# Patient Record
Sex: Male | Born: 1950 | Hispanic: No | Marital: Married | Smoking: Never smoker
Health system: Southern US, Community
[De-identification: ages and names within clinical notes are randomized; demographics above are authoritative.]

## PROBLEM LIST (undated history)

## (undated) DIAGNOSIS — I10 Essential (primary) hypertension: Secondary | ICD-10-CM

## (undated) DIAGNOSIS — E119 Type 2 diabetes mellitus without complications: Secondary | ICD-10-CM

## (undated) DIAGNOSIS — E039 Hypothyroidism, unspecified: Secondary | ICD-10-CM

## (undated) DIAGNOSIS — N200 Calculus of kidney: Secondary | ICD-10-CM

## (undated) HISTORY — PX: CYST EXCISION: SHX5701

## (undated) HISTORY — PX: LITHOTRIPSY: SUR834

---

## 2017-01-31 ENCOUNTER — Ambulatory Visit: Payer: Self-pay | Admitting: Emergency Medicine

## 2017-04-01 DIAGNOSIS — E039 Hypothyroidism, unspecified: Secondary | ICD-10-CM | POA: Insufficient documentation

## 2017-04-01 DIAGNOSIS — Z125 Encounter for screening for malignant neoplasm of prostate: Secondary | ICD-10-CM | POA: Insufficient documentation

## 2017-04-03 DIAGNOSIS — E785 Hyperlipidemia, unspecified: Secondary | ICD-10-CM | POA: Insufficient documentation

## 2017-08-26 ENCOUNTER — Ambulatory Visit (HOSPITAL_COMMUNITY)
Admission: EM | Admit: 2017-08-26 | Discharge: 2017-08-26 | Disposition: A | Discharge status: Home / Self Care | Payer: Medicare HMO

## 2017-08-26 ENCOUNTER — Other Ambulatory Visit: Payer: Self-pay

## 2017-08-26 ENCOUNTER — Encounter (HOSPITAL_COMMUNITY): Payer: Self-pay | Admitting: Emergency Medicine

## 2017-08-26 DIAGNOSIS — W540XXA Bitten by dog, initial encounter: Secondary | ICD-10-CM | POA: Diagnosis not present

## 2017-08-26 DIAGNOSIS — M79642 Pain in left hand: Secondary | ICD-10-CM | POA: Diagnosis not present

## 2017-08-26 DIAGNOSIS — S61412A Laceration without foreign body of left hand, initial encounter: Secondary | ICD-10-CM

## 2017-08-26 HISTORY — DX: Calculus of kidney: N20.0

## 2017-08-26 HISTORY — DX: Essential (primary) hypertension: I10

## 2017-08-26 HISTORY — DX: Type 2 diabetes mellitus without complications: E11.9

## 2017-08-26 MED ORDER — AMOXICILLIN-POT CLAVULANATE 875-125 MG PO TABS: 1.0000 | ORAL_TABLET | Freq: Two times a day (BID) | ORAL | 0 refills | Status: DC

## 2017-08-26 NOTE — ED Notes (Signed)
[facility] report completed and faxed. To be scanned into chart.

## 2017-08-26 NOTE — ED Provider Notes (Signed)
  MRN: [id number] DOB: 06/12/1950  Subjective:   Rodney Miller is a 67 y.o. male presenting for suffering a dog bite to his left hand today over palmar surface.  Patient reports that the dog is up-to-date on his vaccines and is known to them.  Reports that he cleaned his wound at home with warm soapy water and peroxide.  His last Tdap was about 3 years ago per patient.  Denies redness, red streaks, fever, nausea, vomiting, belly pain.  No current facility-administered medications for this encounter.   Current Outpatient Medications:  .  atorvastatin (LIPITOR) 20 MG tablet, Take 20 mg by mouth daily., Disp: , Rfl:  .  Insulin Disposable Pump (V-GO 20) KIT, by Does not apply route., Disp: , Rfl:  .  lisinopril (PRINIVIL,ZESTRIL) 10 MG tablet, Take 10 mg by mouth daily., Disp: , Rfl:  .  naproxen sodium (ALEVE) 220 MG tablet, Take 440 mg by mouth., Disp: , Rfl:    No Known Allergies  Past Medical History:  Diagnosis Date  . Diabetes mellitus without complication ([place])   . Hypertension   . Kidney stones      Denies past surgical history.  Objective:   Vitals: BP (!) 155/66 (BP Location: Right Arm)   Pulse (!) 57   Temp 98 F (36.7 C) (Oral)   Resp 18   SpO2 99%   Physical Exam  Constitutional: He is oriented to person, place, and time. He appears well-developed and well-nourished.  Cardiovascular: Normal rate.  Pulmonary/Chest: Effort normal.  Musculoskeletal:       Hands: Neurological: He is alert and oriented to person, place, and time.   Assessment and Plan :   Dog bite, initial encounter  Laceration of left hand without foreign body, initial encounter  Left hand pain  Patient is a start Augmentin for his dog bite to the hand.  Short arm splint applied for mobilization.  Patient is to follow-up in 2 days for wound recheck.  Counseled on return to clinic precautions.  Tdap is up-to-date.   Jaynee Eagles, [place] 08/26/17 2129

## 2017-08-26 NOTE — Discharge Instructions (Addendum)
Tome 500mg  de Tylenol con ibuprofen 400mg  cada 6 horas con comida para dolor y inflammacion.     Jaynee Eagles, PA-C [facility] [address] [place], [place] [address]

## 2017-08-26 NOTE — ED Triage Notes (Signed)
The patient presented to the [facility] with a complaint of a dog bite to the left hand that occurred earlier today. The patient reported that they owned the dog and the rabies vaccination is current.

## 2017-08-26 NOTE — ED Notes (Signed)
Patient's hand placed in a CHG and sterile water soak.

## 2017-08-28 ENCOUNTER — Encounter: Payer: Self-pay | Admitting: Urgent Care

## 2017-08-28 ENCOUNTER — Ambulatory Visit (INDEPENDENT_AMBULATORY_CARE_PROVIDER_SITE_OTHER): Payer: Medicare HMO | Admitting: Urgent Care

## 2017-08-28 ENCOUNTER — Other Ambulatory Visit: Payer: Self-pay

## 2017-08-28 VITALS — BP 124/68 | HR 75 | Temp 97.9°F | Resp 16 | Ht 65.0 in | Wt 161.0 lb

## 2017-08-28 DIAGNOSIS — S61452A Open bite of left hand, initial encounter: Secondary | ICD-10-CM

## 2017-08-28 DIAGNOSIS — W540XXA Bitten by dog, initial encounter: Secondary | ICD-10-CM | POA: Diagnosis not present

## 2017-08-28 DIAGNOSIS — M79642 Pain in left hand: Secondary | ICD-10-CM

## 2017-08-28 DIAGNOSIS — L03114 Cellulitis of left upper limb: Secondary | ICD-10-CM | POA: Diagnosis not present

## 2017-08-28 NOTE — Patient Instructions (Addendum)
Animal Bite Animal bites can range from mild to serious. An animal bite can result in a scratch on the skin, a deep open cut, a puncture of the skin, a crush injury, or tearing away of the skin or a body part. A small bite from a house pet will usually not cause serious problems. However, some animal bites can become infected or injure a bone or other tissue. Bites from certain animals can be more dangerous because of the risk of spreading rabies, which is a serious viral infection. This risk is higher with bites from stray animals or wild animals, such as raccoons, foxes, skunks, and bats. Dogs are responsible for most animal bites. Children are bitten more often than adults. What are the signs or symptoms? Common symptoms of an animal bite include:  Pain.  Bleeding.  Swelling.  Bruising.  How is this diagnosed? This condition may be diagnosed based on a physical exam and medical history. Your health care provider will examine the wound and ask for details about the animal and how the bite happened. You may also have tests, such as:  Blood tests to check for infection or to determine if surgery is needed.  X-rays to check for damage to bones or joints.  Culture test. This uses a sample of fluid from the wound to check for infection.  How is this treated? Treatment varies depending on the location and type of animal bite and your medical history. Treatment may include:  Wound care. This often includes cleaning the wound, flushing the wound with saline solution, and applying a bandage (dressing). Sometimes, the wound is left open to heal because of the high risk of infection. However, in some cases, the wound may be closed with stitches (sutures), staples, skin glue, or adhesive strips.  Antibiotic medicine.  Tetanus shot.  Rabies treatment if the animal could have rabies.  In some cases, bites that have become infected may require IV antibiotics and surgical treatment in the  hospital. Follow these instructions at home: Wound care  Follow instructions from your health care provider about how to take care of your wound. Make sure you: ? Wash your hands with soap and water before you change your dressing. If soap and water are not available, use hand sanitizer. ? Change your dressing as told by your health care provider. ? Leave sutures, skin glue, or adhesive strips in place. These skin closures may need to be in place for 2 weeks or longer. If adhesive strip edges start to loosen and curl up, you may trim the loose edges. Do not remove adhesive strips completely unless your health care provider tells you to do that.  Check your wound every day for signs of infection. Watch for: ? Increasing redness, swelling, or pain. ? Fluid, blood, or pus. General instructions  Take or apply over-the-counter and prescription medicines only as told by your health care provider.  If you were prescribed an antibiotic, take or apply it as told by your health care provider. Do not stop using the antibiotic even if your condition improves.  Keep the injured area raised (elevated) above the level of your heart while you are sitting or lying down, if this is possible.  If directed, apply ice to the injured area. ? Put ice in a plastic bag. ? Place a towel between your skin and the bag. ? Leave the ice on for 20 minutes, 2-3 times per day.  Keep all follow-up visits as told by your health care   provider. This is important. Contact a health care provider if:  You have increasing redness, swelling, or pain at the site of your wound.  You have a general feeling of sickness (malaise).  You feel nauseous or you vomit.  You have pain that does not get better. Get help right away if:  You have a red streak extending away from your wound.  You have fluid, blood, or pus coming from your wound.  You have a fever or chills.  You have trouble moving your injured area.  You have  numbness or tingling extending beyond the wound. This information is not intended to replace advice given to you by your health care provider. Make sure you discuss any questions you have with your health care provider. Document Released: 11/20/2010 Document Revised: 07/12/2015 Document Reviewed: 07/20/2014 Elsevier Interactive Patient Education  2018 [employer].     IF you received an x-ray today, you will receive an invoice from [facility]. Please contact [facility] at [phone] with questions or concerns regarding your invoice.   IF you received labwork today, you will receive an invoice from [facility]. Please contact [facility] at [phone] with questions or concerns regarding your invoice.   Our billing staff will not be able to assist you with questions regarding bills from these companies.  You will be contacted with the lab results as soon as they are available. The fastest way to get your results is to activate your My Chart account. Instructions are located on the last page of this paperwork. If you have not heard from [place] regarding the results in 2 weeks, please contact this office.

## 2017-08-28 NOTE — Progress Notes (Signed)
    MRN: [id number] DOB: 12/17/1950  Subjective:   Rodney Miller is a 67 y.o. male presenting for follow up on left hand cellulitis following dog bite on 08/26/2017.  Patient was initially seen at an urgent care clinic, started on Augmentin and had his left hand placed in splint.  He was advised to take Tylenol and ibuprofen for pain inflammation.  He has worn the splint faithfully.  Denies fever, red streaks, worsening pain or swelling, nausea, vomiting.  Patient has left the Steri-Strips in place.  Rodney Miller has a current medication list which includes the following prescription(s): amoxicillin-clavulanate, atorvastatin, v-go 20, lisinopril, and naproxen sodium. Also has No Known Allergies.  Rodney Miller  has a past medical history of Diabetes mellitus without complication ([place]), Hypertension, and Kidney stones. Also  has no past surgical history on file.  Objective:   Vitals: BP 124/68   Pulse 75   Temp 97.9 F (36.6 C) (Oral)   Resp 16   Ht 5\' 5"  (1.651 m)   Wt 161 lb (73 kg)   SpO2 99%   BMI 26.79 kg/m   Physical Exam  Constitutional: He is oriented to person, place, and time. He appears well-developed and well-nourished.  Cardiovascular: Normal rate.  Pulmonary/Chest: Effort normal.  Musculoskeletal:       Left hand: He exhibits tenderness. He exhibits normal range of motion, normal capillary refill, no deformity and no swelling. Normal sensation noted. Normal strength noted.       Hands: Neurological: He is alert and oriented to person, place, and time.   Assessment and Plan :   Dog bite of left hand, initial encounter  Left hand pain  Cellulitis of left hand  Wound care performed, removed hand splint.  Patient is no longer uses and will instead use nonadherent dressings.  Change these daily and finish course of Augmentin.  Return to clinic precautions discussed.  Jaynee Eagles, PA-C [facility] [phone] 08/28/2017 1:38 PM

## 2017-12-02 LAB — COLOGUARD: Cologuard: NEGATIVE

## 2018-01-30 DIAGNOSIS — E1065 Type 1 diabetes mellitus with hyperglycemia: Secondary | ICD-10-CM | POA: Insufficient documentation

## 2018-03-09 DIAGNOSIS — G3184 Mild cognitive impairment, so stated: Secondary | ICD-10-CM | POA: Insufficient documentation

## 2018-04-09 ENCOUNTER — Encounter: Payer: Self-pay | Admitting: Family Medicine

## 2018-04-09 ENCOUNTER — Ambulatory Visit (INDEPENDENT_AMBULATORY_CARE_PROVIDER_SITE_OTHER): Payer: Medicare HMO | Admitting: Family Medicine

## 2018-04-09 DIAGNOSIS — Z794 Long term (current) use of insulin: Secondary | ICD-10-CM

## 2018-04-09 DIAGNOSIS — M6530 Trigger finger, unspecified finger: Secondary | ICD-10-CM | POA: Diagnosis not present

## 2018-04-09 DIAGNOSIS — IMO0001 Reserved for inherently not codable concepts without codable children: Secondary | ICD-10-CM | POA: Insufficient documentation

## 2018-04-09 DIAGNOSIS — E785 Hyperlipidemia, unspecified: Secondary | ICD-10-CM

## 2018-04-09 DIAGNOSIS — I10 Essential (primary) hypertension: Secondary | ICD-10-CM | POA: Diagnosis not present

## 2018-04-09 DIAGNOSIS — F0390 Unspecified dementia without behavioral disturbance: Secondary | ICD-10-CM | POA: Insufficient documentation

## 2018-04-09 DIAGNOSIS — R2241 Localized swelling, mass and lump, right lower limb: Secondary | ICD-10-CM

## 2018-04-09 DIAGNOSIS — E1069 Type 1 diabetes mellitus with other specified complication: Secondary | ICD-10-CM | POA: Insufficient documentation

## 2018-04-09 DIAGNOSIS — G3184 Mild cognitive impairment, so stated: Secondary | ICD-10-CM

## 2018-04-09 DIAGNOSIS — E1159 Type 2 diabetes mellitus with other circulatory complications: Secondary | ICD-10-CM | POA: Insufficient documentation

## 2018-04-09 DIAGNOSIS — E119 Type 2 diabetes mellitus without complications: Secondary | ICD-10-CM

## 2018-04-09 NOTE — Progress Notes (Signed)
Subjective:  Rodney Miller is a 68 y.o. male who presents today with a chief complaint of hand stiffness and to establish care.   HPI:  Hand stiffness, new problem to provider Symptoms started several weeks ago.  He has noticed increasing stiffness in the morning and his right second finger and left third finger.  Symptoms typically only occur in the morning.  States that he usually has to use his other hand to fully extend the fingers that get stuck.  He has had some associated pain at the first joint in each release fingers as well.  He has tried using a glove at night to help keep his finger straight which has helped some.  He is also tried Advil which has helped a little bit.  No weakness or numbness.  No other treatments tried.  No other obvious alleviating or aggravating factors.  Leg lump, chronic problem, new to provider Patient also reports having a leg lump in his right posterior leg for the past 20 or so years.  It used to be very painful to palpation and would radiate down into his foot.  This has actually improved over the last several months.  He recently saw an orthopedist who recommended MRI of the area, however patient was told that the MRI would cost around $180 and thus he decided to not get it done.  Overall, the area seems to be stable.  It actually seems to be decreasing in size and becoming less symptomatic.  He has not had any drainage from the area.  No obvious precipitating events.  His stable, chronic medical conditions are outlined below:  # Dyslipidemia - Currently on lipitor '20mg'$  daily and tolerating well - No myalgias  # Hypertension - Currently on lisinopril '10mg'$  daily and tolerating well - No chest pain or shortness of breath  # Mild cognitive impairment - Was diagnosed by his previous PCP. - Was previously on donepezil but has since stopped as he did not think it was making a difference.  % IDDM  - Currently follows with endocrinology - Dr Denton Lank - On  basal-bolus regimen with tresiba and aspart  ROS: Per HPI, otherwise a complete review of systems was negative.   PMH:  The following were reviewed and entered/updated in epic: Past Medical History:  Diagnosis Date  . Diabetes mellitus without complication ([place]   . Hypertension   . Kidney stones    Patient Active Problem List   Diagnosis Date Noted  . Trigger finger 04/09/2018  . Mass of right lower leg 04/09/2018  . Dyslipidemia 04/09/2018  . Hypertension 04/09/2018  . Mild cognitive impairment 04/09/2018  . Insulin dependent diabetes mellitus ([place]) 04/09/2018   History reviewed. No pertinent surgical history.  Family History  Problem Relation Age of Onset  . Juvenile Diabetes Son   . Juvenile Diabetes Grandchild   . Prostate cancer Neg Hx   . [place] cancer Neg Hx     Medications- reviewed and updated Current Outpatient Medications  Medication Sig Dispense Refill  . atorvastatin (LIPITOR) 20 MG tablet Take 20 mg by mouth daily.    . Insulin Aspart, w/Niacinamide, (FIASP FLEXTOUCH) 100 UNIT/ML SOPN Inject 8-10 Units into the skin daily.     . insulin degludec (TRESIBA) 100 UNIT/ML SOPN FlexTouch Pen Inject 24 Units into the skin daily.    Marland Kitchen lisinopril (PRINIVIL,ZESTRIL) 10 MG tablet Take 10 mg by mouth daily.     No current facility-administered medications for this visit.  Allergies-reviewed and updated No Known Allergies  Social History   Socioeconomic History  . Marital status: Married    Spouse name: Not on file  . Number of children: Not on file  . Years of education: Not on file  . Highest education level: Not on file  Occupational History  . Not on file  Social Needs  . Financial resource strain: Not on file  . Food insecurity:    Worry: Not on file    Inability: Not on file  . Transportation needs:    Medical: Not on file    Non-medical: Not on file  Tobacco Use  . Smoking status: Never Smoker  . Smokeless tobacco: Never Used  Substance  and Sexual Activity  . Alcohol use: Not Currently    Frequency: Never  . Drug use: Never  . Sexual activity: Not on file  Lifestyle  . Physical activity:    Days per week: Not on file    Minutes per session: Not on file  . Stress: Not on file  Relationships  . Social connections:    Talks on phone: Not on file    Gets together: Not on file    Attends religious service: Not on file    Active member of club or organization: Not on file    Attends meetings of clubs or organizations: Not on file    Relationship status: Not on file  Other Topics Concern  . Not on file  Social History Narrative  . Not on file       Objective:  Physical Exam: BP 128/72 (BP Location: Left Arm, Patient Position: Sitting, Cuff Size: Normal)   Pulse 68   Temp 98.2 F (36.8 C) (Oral)   Ht '5\' 5"'$  (1.651 m)   Wt 157 lb 9.6 oz (71.5 kg)   SpO2 99%   BMI 26.23 kg/m   Gen: NAD, resting comfortably CV: RRR with no murmurs appreciated Pulm: NWOB, CTAB with no crackles, wheezes, or rhonchi GI: Normal bowel sounds present. Soft, Nontender, Nondistended. MSK:  -Hand: Trigger finger deformity noted on left third digit and right second digit.  Some associated tenderness to palpation along MCP joint of each of these digits as well.  Neurovascular intact distally. -Right lower extremity: Approximately 3 cm diameter mass along distal lateral edge of gastrocnemius.  This area is mobile.  It is moderately tender to palpation. Skin: Warm, dry Neuro: Grossly normal, moves all extremities Psych: Normal affect and thought content  Assessment/Plan:  Trigger finger Recommend referral to sports medicine for steroid injection however patient declined.  He will continue using splinting and anti-inflammatories at home.  Mass of right lower leg Unclear underlying etiology, however it is reassuring that has been present for the past 20 years and has not worsened over that time.  I did discuss diagnostic possibilities and  recommended getting MRI however patient elected to continue with watchful waiting.  Discussed reasons to return to care for this.  Dyslipidemia Stable on Lipitor 20 mg daily.  He will follow-up with me in 6 to 12 months for CPE with blood draw.  Check lipid panel at that time.  Hypertension At goal on lisinopril 10 mg daily.  We will continue this.  Check C met with next blood draw.  Mild cognitive impairment No obvious impairment based on today's exam.  Encouraged mentally stimulating activities daily.  Consider referral to neurology if continues to be an issue.  Insulin dependent diabetes mellitus ([place]) Continue management per endocrinology.  Preventative Healthcare Patient was instructed to return soon for CPE. Health Maintenance Due  Topic Date Due  . HEMOGLOBIN A1C  01/09/1949  . Hepatitis C Screening  07/20/1950  . FOOT EXAM  02/16/1959  . OPHTHALMOLOGY EXAM  02/16/1959  . TETANUS/TDAP  02/16/1968  . INFLUENZA VACCINE  10/16/2017   [name]. Jerline Pain, MD 04/09/2018 12:13 PM

## 2018-04-09 NOTE — Assessment & Plan Note (Signed)
Continue management per endocrinology. 

## 2018-04-09 NOTE — Assessment & Plan Note (Signed)
Recommend referral to sports medicine for steroid injection however patient declined.  He will continue using splinting and anti-inflammatories at home.

## 2018-04-09 NOTE — Assessment & Plan Note (Signed)
No obvious impairment based on today's exam.  Encouraged mentally stimulating activities daily.  Consider referral to neurology if continues to be an issue.

## 2018-04-09 NOTE — Assessment & Plan Note (Signed)
Stable on Lipitor 20 mg daily.  He will follow-up with me in 6 to 12 months for CPE with blood draw.  Check lipid panel at that time.

## 2018-04-09 NOTE — Assessment & Plan Note (Signed)
At goal on lisinopril 10 mg daily.  We will continue this.  Check C met with next blood draw.

## 2018-04-09 NOTE — Assessment & Plan Note (Signed)
Unclear underlying etiology, however it is reassuring that has been present for the past 20 years and has not worsened over that time.  I did discuss diagnostic possibilities and recommended getting MRI however patient elected to continue with watchful waiting.  Discussed reasons to return to care for this.

## 2018-04-09 NOTE — Patient Instructions (Signed)
It was very nice to see you today!  Please let me know if you would like to see Dr Paulla Fore to look at your hand.  Let me know if the spot on your leg changes in any way.   Come back to see me in 6-12 months for your physical with blood work.  Take care, Dr Jerline Pain

## 2018-05-27 ENCOUNTER — Other Ambulatory Visit: Payer: Self-pay

## 2018-05-27 DIAGNOSIS — M6530 Trigger finger, unspecified finger: Secondary | ICD-10-CM

## 2018-06-02 ENCOUNTER — Ambulatory Visit (INDEPENDENT_AMBULATORY_CARE_PROVIDER_SITE_OTHER): Payer: Medicare HMO | Admitting: Sports Medicine

## 2018-06-02 ENCOUNTER — Ambulatory Visit: Payer: Self-pay

## 2018-06-02 ENCOUNTER — Other Ambulatory Visit: Payer: Self-pay

## 2018-06-02 ENCOUNTER — Encounter: Payer: Self-pay | Admitting: Sports Medicine

## 2018-06-02 VITALS — BP 134/82 | HR 74 | Ht 65.0 in | Wt 159.4 lb

## 2018-06-02 DIAGNOSIS — M65321 Trigger finger, right index finger: Secondary | ICD-10-CM | POA: Diagnosis not present

## 2018-06-02 DIAGNOSIS — M65332 Trigger finger, left middle finger: Secondary | ICD-10-CM | POA: Diagnosis not present

## 2018-06-02 NOTE — Procedures (Signed)
PROCEDURE NOTE:  Ultrasound Guided: Injection: Left 3rd finger trigger finger Images were obtained and interpreted by myself, Teresa Coombs, DO  Images have been saved and stored to PACS system. Images obtained on: GE S7 Ultrasound machine    ULTRASOUND FINDINGS:  Marked triggering of flexor tendons.  Minimal hypoechoic change with thickening of the A1 pulley.  Right index finger with only minimal triggering and minimal changes.  DESCRIPTION OF PROCEDURE:  The patient's clinical condition is marked by substantial pain and/or significant functional disability. Other conservative therapy has not provided relief, is contraindicated, or not appropriate. There is a reasonable likelihood that injection will significantly improve the patient's pain and/or functional impairment.   After discussing the risks, benefits and expected outcomes of the injection and all questions were reviewed and answered, the patient wished to undergo the above named procedure.  Verbal consent was obtained.  The ultrasound was used to identify the target structure and adjacent neurovascular structures. The skin was then prepped in sterile fashion and the target structure was injected under direct visualization using sterile technique as below:  Single injection performed as below: PREP: Alcohol, Ethel Chloride and 1 cc 1% lidocaine on Insulin Needle APPROACH:direct, single injection, 25g 1.5 in. INJECTATE: 0.5 cc 1% lidocaine, 0.5 cc 0.5% Marcaine and 0.5 cc 40mg /mL DepoMedrol ASPIRATE: None DRESSING: Band-Aid  Post procedural instructions including recommending icing and warning signs for infection were reviewed.    This procedure was well tolerated and there were no complications.   IMPRESSION: Succesful Ultrasound Guided: Injection

## 2018-06-02 NOTE — Procedures (Signed)
Rodney Miller. [place], [place] at [facility] [phone]  Rodney Miller - 68 y.o. male MRN [id number]  Date of birth: Feb 17, 1951  Visit Date: June 02, 2018  PCP: Vivi Barrack, MD   Referred by: Vivi Barrack, MD  SUBJECTIVE:  Chief Complaint  Patient presents with  . Right Index Finger - Initial Assessment    Stiffness, gets, stuck, worse in the AM. Has tried "glove" at night and Advil prn with minimal relief.   . Left Middle Finger - Initial Assessment    HPI: Several years of worsening finger pain and triggering that has exacerbated over the past 2 months.  He is having significant sticking of his finger in a flexed position especially when waking up first thing in the morning.  He has been taping his finger and this is been helpful.  He has never had any issues similar to this in the past.  He is getting some issues with this on his right index finger but these are significantly less severe.  REVIEW OF SYSTEMS: Night time disturbances: Reports, Some pain at night occasionally awakens him at night especially when this becomes triggered Fevers, chills and night sweats: Denies Unexplained weight loss: Denies Personal history of cancer: Denies Changes in bowel or bladder habits: Denies Recent unreported falls: Denies New or worsening dyspnea or wheezing: Denies Headaches and dizziness: Denies Numbness, tingling and weakness in the extremities: Denies Dizziness or presyncopal episodes: Denies Lower extremity edema: Denies  HISTORY:  Prior history reviewed and updated per electronic medical record.  Patient Active Problem List   Diagnosis Date Noted  . Trigger finger 04/09/2018  . Mass of right lower leg 04/09/2018  . Dyslipidemia 04/09/2018  . Hypertension 04/09/2018  . Mild cognitive impairment 04/09/2018  . Insulin dependent diabetes mellitus ([place]) 04/09/2018   Social History   Occupational History  . Not on file  Tobacco  Use  . Smoking status: Never Smoker  . Smokeless tobacco: Never Used  Substance and Sexual Activity  . Alcohol use: Not Currently    Frequency: Never  . Drug use: Never  . Sexual activity: Not on file   Social History   Social History Narrative  . Not on file    Past Medical History:  Diagnosis Date  . Diabetes mellitus without complication ([place])   . Hypertension   . Kidney stones     History reviewed. No pertinent surgical history. family history includes Juvenile Diabetes in his grandchild and son. There is no history of Prostate cancer or Lamp cancer.  OBJECTIVE:  VS:  HT:5\' 5"  (165.1 cm)   WT:159 lb 6.4 oz (72.3 kg)  BMI:26.53    BP:134/82  HR:74bpm  TEMP: ( )  RESP:99 %   PHYSICAL EXAM: Well-developed, Well-nourished and In no acute distress  Pupils are equal., EOM intact without nystagmus. and No scleral icterus.  Alert & appropriately interactive. and Not depressed or anxious appearing.  Warm and well perfused   Left third finger with marked pain over the A1 pulley.  He has marked triggering that is painful.  He has only full extension with passive extension.  His right index finger has full flexion extension with a mild nodularity focal or overt locking.  Good capillary refill.  Grip strength is intact bilaterally.   ASSESSMENT:  1. Trigger finger, left middle finger   2. Trigger finger, right index finger     PROCEDURES:  US Guided Injection per procedure note  PLAN:  Pertinent additional documentation may be included in corresponding procedure notes, imaging studies, problem based documentation and patient instructions.  No problem-specific Assessment & Plan notes found for this encounter.  Fairly significant left middle finger trigger finger point that is catching he is unable to actively extend it and passive extension does cause significant pain.  Injection performed today and this should do well.  If any lack of improvement acquire surgical  release.  Voltaren gel for both hands      Activity modifications and the importance of avoiding exacerbating activities (limiting pain to no more than a 4 / 10 during or following activity) recommended and discussed.   Discussed red flag symptoms that warrant earlier emergent evaluation and patient voices understanding.    No orders of the defined types were placed in this encounter. Lab Orders  No laboratory test(s) ordered today    Imaging Orders  [place] MSK POCT ULTRASOUND  Referral Orders  No referral(s) requested today      No follow-ups on file.          Rodney Miller, [place] [facility] Physician

## 2018-06-02 NOTE — Patient Instructions (Addendum)

## 2018-06-08 ENCOUNTER — Telehealth: Payer: Self-pay | Admitting: Sports Medicine

## 2018-06-08 MED ORDER — DICLOFENAC SODIUM 1 % TD GEL: TRANSDERMAL | 1 refills | Status: DC

## 2018-06-08 NOTE — Telephone Encounter (Signed)
See note

## 2018-06-08 NOTE — Telephone Encounter (Signed)
Copied from [place] [id number]. Topic: Quick Communication - Rx Refill/Question >> Jun 08, 2018  2:57 PM Burchel, Abbi R wrote: Medication: Voltaren Gel  Preferred Pharmacy:  [facility], [place]  [phone] (Phone) [phone] (Fax)  Pt states this was to be sent in by Dr Paulla Fore per last OV.  Please send to Pharmacy.

## 2018-06-08 NOTE — Telephone Encounter (Signed)
Please see note.

## 2018-06-08 NOTE — Telephone Encounter (Signed)
Rx sent to pharmacy   

## 2018-07-16 ENCOUNTER — Ambulatory Visit: Payer: Medicare HMO | Admitting: Sports Medicine

## 2018-08-04 ENCOUNTER — Encounter: Payer: Self-pay | Admitting: Family Medicine

## 2018-08-17 DIAGNOSIS — E1042 Type 1 diabetes mellitus with diabetic polyneuropathy: Secondary | ICD-10-CM | POA: Insufficient documentation

## 2018-09-09 ENCOUNTER — Ambulatory Visit: Payer: Self-pay | Admitting: Family Medicine

## 2018-09-09 NOTE — Telephone Encounter (Signed)
Patient has symptoms of vomiting on Monday and feeling fatigued.Informed patient we are not able to see him in the office.She stated that insulin pump is broken I informed her that she needs to see the Endocrinology,she stated that they are the process of changing.Wants to know if blood work can be done and have BS checked.Please Advise

## 2018-09-09 NOTE — Telephone Encounter (Signed)
Madelyn please advise as you were looking into this for scheduling. Route to Dr. Marigene Ehlers team to advise after.

## 2018-09-09 NOTE — Telephone Encounter (Signed)
I was trying to get Mr. Ferrante scheduled for an appt. at another office due to all of our providers here being full.  Sheena Cox at the [place] office spoke with Dr. Elease Hashimoto and they suggested that pt Rodney Miller see his endocrinology provider. I called pt to let him know and I spoke with Mr. Delellis wife. She informed me that he does not have a provider at endocrinology, they are in-between. She said an appt tomorrow would be fine so I scheduled an appt with Dr. Jerline Pain tomorrow 09/10/18 at 11:20am.

## 2018-09-09 NOTE — Telephone Encounter (Signed)
Pt's wife calling, on DPR, pt present during call. Reports BS 336 this afternoon. Has insulin pump, states BS 500 Monday, "I think his pump was malfunctioning but we got it working now, BS still high though." Reports urine positive ketones. Denies rapid breathing, had frequent urination for few days, not presently, generalized weakness, no vomiting. Pts wife states they called ED and UC for advise, told to call PCP. States they would like to see provider. [place] called practice, Madelyn, will route to practice as instructed for consideration of appt. Care advise given to pt, verbalizes understanding.   Reason for Disposition . [1] Blood glucose > 300 mg/dL (16.7 mmol/L) AND [2] two or more times in a row  Answer Assessment - Initial Assessment Questions 1. BLOOD GLUCOSE: "What is your blood glucose level?"      336 2. ONSET: "When did you check the blood glucose?"     now 3. USUAL RANGE: "What is your glucose level usually?" (e.g., usual fasting morning value, usual evening value)     150-170 4. KETONES: "Do you check for ketones (urine or blood test strips)?" If yes, ask: "What does the test show now?"      High x 2 days 5. TYPE 1 or 2:  "Do you know what type of diabetes you have?"  (e.g., Type 1, Type 2, Gestational; doesn't know)      Type 1 6. INSULIN: "Do you take insulin?" "What type of insulin(s) do you use? What is the mode of delivery? (syringe, pen (e.g., injection or  pump)?"      Pump 7. DIABETES PILLS: "Do you take any pills for your diabetes?" If yes, ask: "Have you missed taking any pills recently?"     no 8. OTHER SYMPTOMS: "Do you have any symptoms?" (e.g., fever, frequent urination, difficulty breathing, dizziness, weakness, vomiting)    Weakness, frequent urination Monday, not presently  Protocols used: DIABETES - HIGH BLOOD SUGAR-A-AH

## 2018-09-10 ENCOUNTER — Ambulatory Visit (INDEPENDENT_AMBULATORY_CARE_PROVIDER_SITE_OTHER): Payer: Medicare HMO | Admitting: Family Medicine

## 2018-09-10 ENCOUNTER — Encounter: Payer: Self-pay | Admitting: Family Medicine

## 2018-09-10 VITALS — BP 139/86 | HR 88

## 2018-09-10 DIAGNOSIS — E1065 Type 1 diabetes mellitus with hyperglycemia: Secondary | ICD-10-CM

## 2018-09-10 DIAGNOSIS — J029 Acute pharyngitis, unspecified: Secondary | ICD-10-CM | POA: Diagnosis not present

## 2018-09-10 MED ORDER — DICLOFENAC SODIUM 75 MG PO TBEC: 75.0000 mg | DELAYED_RELEASE_TABLET | Freq: Two times a day (BID) | ORAL | 0 refills | Status: DC

## 2018-09-10 MED ORDER — LIDOCAINE VISCOUS HCL 2 % MT SOLN: 15.0000 mL | OROMUCOSAL | 1 refills | Status: DC | PRN

## 2018-09-10 NOTE — Progress Notes (Signed)
° ° °  Chief Complaint:  Kensley Nolt is a 68 y.o. male who presents today for a virtual office visit with a chief complaint of sore throat.   Assessment/Plan:  Sore Throat No signs of infection.  Likely secondary to irritation secondary to several episodes of vomiting and retching.  We will send a prescription for viscous lidocaine to use as needed.  Also start diclofenac.  Discussed reasons to return to care.  Follow-up as needed.  Type 1 diabetes mellitus with hyperglycemia (HCC) Patient likely had mild DKA earlier this week that is now improving.  Does not currently have any symptoms of DKA and his reported blood sugars are acceptable.  Encouraged patient continue with his current basal-bolus regimen.  Also recommended good oral hydration with plenty of potassium in his diet.  Will place referral for them to follow-up with new endocrinologist in town.  Discussed reasons to return to care and seek emergent care for appointment with endocrinology.     Subjective:  HPI:  Sore Throat Patient unfortunately had an insulin pump malfunction 4 days ago that resulted in him having significantly increased blood sugar readings in the 400s and 500s.  He had associated symptoms that time including confusion, fatigue, and several episodes of vomiting.  His wife was able to give him subcutaneous injection of some NovoLog 70/30 with significant improvement in his symptoms.  His pump started working again a couple of days ago and over the last few days his blood sugars have been back down into the 100s and 200s.  Over last couple days he has been checking urine ketones and they have been on the large side.  Overall symptoms seem to be improving still is quite fatigued sizable bit of nausea.  He has been trying to drink plenty of fluids.  Over the last couple of days he has noticed worsening sore throat.  He attributes this to his episodes of vomiting and retching.  Symptoms seem to be stable.  Does not have any  fevers.  No reported cough or shortness of breath.  No reported rhinorrhea.  No reported sick contacts.  He has tried gargling with Novocain with modest improvement.  No other treatments tried.  No other obvious alleviating or aggravating factors.   ROS: Per HPI  PMH: He reports that he has never smoked. He has never used smokeless tobacco. He reports previous alcohol use. He reports that he does not use drugs.      Objective/Observations  Physical Exam: Gen: NAD, resting comfortably HEENT: OP erythematous with no exudate or masses Pulm: Normal work of breathing Neuro: Grossly normal, moves all extremities Psych: Normal affect and thought content  Virtual Visit via Video   I connected with Samuell Neenan on 09/10/18 at 11:20 AM EDT by a video enabled telemedicine application and verified that I am speaking with the correct person using two identifiers. I discussed the limitations of evaluation and management by telemedicine and the availability of in person appointments. The patient expressed understanding and agreed to proceed.   Patient location: Home Provider location: [place] participating in the virtual visit: Myself and Patient     [name]. Jerline Pain, MD 09/10/2018 12:18 PM

## 2018-09-10 NOTE — Assessment & Plan Note (Signed)
Patient likely had mild DKA earlier this week that is now improving.  Does not currently have any symptoms of DKA and his reported blood sugars are acceptable.  Encouraged patient continue with his current basal-bolus regimen.  Also recommended good oral hydration with plenty of potassium in his diet.  Will place referral for them to follow-up with new endocrinologist in town.  Discussed reasons to return to care and seek emergent care for appointment with endocrinology.

## 2018-09-10 NOTE — Telephone Encounter (Signed)
Pt needs to be evaluated urgently if he is having vomiting and high blood sugars.  [name]. Jerline Pain, MD 09/10/2018 8:06 AM

## 2018-09-10 NOTE — Telephone Encounter (Signed)
Patient will have virtual visit today.

## 2018-10-01 ENCOUNTER — Ambulatory Visit (INDEPENDENT_AMBULATORY_CARE_PROVIDER_SITE_OTHER): Payer: Medicare HMO | Admitting: Family Medicine

## 2018-10-01 ENCOUNTER — Encounter: Payer: Self-pay | Admitting: Family Medicine

## 2018-10-01 ENCOUNTER — Other Ambulatory Visit: Payer: Self-pay

## 2018-10-01 VITALS — BP 134/77 | HR 63 | Temp 97.8°F | Ht 65.0 in | Wt 164.2 lb

## 2018-10-01 DIAGNOSIS — E1069 Type 1 diabetes mellitus with other specified complication: Secondary | ICD-10-CM | POA: Diagnosis not present

## 2018-10-01 DIAGNOSIS — E785 Hyperlipidemia, unspecified: Secondary | ICD-10-CM | POA: Diagnosis not present

## 2018-10-01 DIAGNOSIS — IMO0001 Reserved for inherently not codable concepts without codable children: Secondary | ICD-10-CM

## 2018-10-01 DIAGNOSIS — Z23 Encounter for immunization: Secondary | ICD-10-CM

## 2018-10-01 DIAGNOSIS — E663 Overweight: Secondary | ICD-10-CM

## 2018-10-01 DIAGNOSIS — Z125 Encounter for screening for malignant neoplasm of prostate: Secondary | ICD-10-CM | POA: Diagnosis not present

## 2018-10-01 DIAGNOSIS — I10 Essential (primary) hypertension: Secondary | ICD-10-CM | POA: Diagnosis not present

## 2018-10-01 DIAGNOSIS — E1159 Type 2 diabetes mellitus with other circulatory complications: Secondary | ICD-10-CM | POA: Diagnosis not present

## 2018-10-01 DIAGNOSIS — Z0001 Encounter for general adult medical examination with abnormal findings: Secondary | ICD-10-CM

## 2018-10-01 DIAGNOSIS — Z6827 Body mass index (BMI) 27.0-27.9, adult: Secondary | ICD-10-CM

## 2018-10-01 DIAGNOSIS — R5383 Other fatigue: Secondary | ICD-10-CM

## 2018-10-01 DIAGNOSIS — Z1159 Encounter for screening for other viral diseases: Secondary | ICD-10-CM

## 2018-10-01 DIAGNOSIS — Z794 Long term (current) use of insulin: Secondary | ICD-10-CM

## 2018-10-01 DIAGNOSIS — E039 Hypothyroidism, unspecified: Secondary | ICD-10-CM | POA: Diagnosis not present

## 2018-10-01 DIAGNOSIS — E119 Type 2 diabetes mellitus without complications: Secondary | ICD-10-CM

## 2018-10-01 LAB — COMPREHENSIVE METABOLIC PANEL
ALT: 23 U/L (ref 0–53)
AST: 17 U/L (ref 0–37)
Albumin: 4.2 g/dL (ref 3.5–5.2)
Alkaline Phosphatase: 61 U/L (ref 39–117)
BUN: 18 mg/dL (ref 6–23)
CO2: 29 mEq/L (ref 19–32)
Calcium: 9.7 mg/dL (ref 8.4–10.5)
Chloride: 106 mEq/L (ref 96–112)
Creatinine, Ser: 1.03 mg/dL (ref 0.40–1.50)
GFR: 71.47 mL/min (ref >60.00)
Glucose, Bld: 125 mg/dL — ABNORMAL HIGH (ref 70–99)
Potassium: 4.6 mEq/L (ref 3.5–5.1)
Sodium: 142 mEq/L (ref 135–145)
Total Bilirubin: 0.8 mg/dL (ref 0.2–1.2)
Total Protein: 6.5 g/dL (ref 6.0–8.3)

## 2018-10-01 LAB — LIPID PANEL
Cholesterol: 126 mg/dL (ref 0–200)
HDL: 41.8 mg/dL (ref >39.00)
LDL Cholesterol: 62 mg/dL (ref 0–99)
NonHDL: 84.11
Total CHOL/HDL Ratio: 3
Triglycerides: 109 mg/dL (ref 0.0–149.0)
VLDL: 21.8 mg/dL (ref 0.0–40.0)

## 2018-10-01 LAB — CBC
HCT: 39.8 % (ref 39.0–52.0)
Hemoglobin: 13.3 g/dL (ref 13.0–17.0)
MCHC: 33.4 g/dL (ref 30.0–36.0)
MCV: 97.6 fl (ref 78.0–100.0)
Platelets: 321 10*3/uL (ref 150.0–400.0)
RBC: 4.08 Mil/uL — ABNORMAL LOW (ref 4.22–5.81)
RDW: 13.8 % (ref 11.5–15.5)
WBC: 5 10*3/uL (ref 4.0–10.5)

## 2018-10-01 LAB — IBC + FERRITIN
Ferritin: 84.9 ng/mL (ref 22.0–322.0)
Iron: 151 ug/dL (ref 42–165)
Saturation Ratios: 48.2 % (ref 20.0–50.0)
Transferrin: 224 mg/dL (ref 212.0–360.0)

## 2018-10-01 LAB — PSA, MEDICARE: PSA: 0.95 ng/ml (ref 0.10–4.00)

## 2018-10-01 LAB — VITAMIN B12: Vitamin B-12: 1414 pg/mL — ABNORMAL HIGH (ref 211–911)

## 2018-10-01 LAB — TSH: TSH: 0.75 u[IU]/mL (ref 0.35–4.50)

## 2018-10-01 LAB — HEMOGLOBIN A1C: Hgb A1c MFr Bld: 7.4 % — ABNORMAL HIGH (ref 4.6–6.5)

## 2018-10-01 NOTE — Assessment & Plan Note (Signed)
Check lipid panel.  Continue Lipitor 20 mg daily. 

## 2018-10-01 NOTE — Progress Notes (Signed)
Chief Complaint:  Rodney Miller is a 68 y.o. male who presents today for his annual comprehensive physical exam and subsequent medicare annual wellness visit.    Assessment/Plan:  Hypothyroidism Continue levothyroxine 75 mcg daily.  Check TSH.  Insulin dependent diabetes mellitus ([place]) Continue management per endocrinology.  Check A1c.  Hypertension associated with diabetes ([place]) At goal.  Continue lisinopril 10 mg daily.  Dyslipidemia due to type 1 diabetes mellitus (HCC) Check lipid panel.  Continue Lipitor 20 mg daily.   Body mass index is 27.32 kg/m. / Overweight BMI Metric Follow Up - 10/01/18 1223      BMI Metric Follow Up-Please document annually   BMI Metric Follow Up  Education provided       Fatigue Likely due to recent DKA episode. Seems to be recovering. Will check CBC, CMET, and TSH.   History of Iron deficiency anemia Check CBC and iron panel.   Dysphagia / Sore throat Improving.  Likely due to throat irritation from recent vomiting spells.  Discussed reasons to return to care.  Preventative Healthcare: Check Hep C antibody. Pneumonia vaccine given today.  Due for flu vaccine this fall.  Due for [place] cancer screening in 2022. Check PSA.   Patient Counseling(The following topics were reviewed and/or handout was given):  -Nutrition: Stressed importance of moderation in sodium/caffeine intake, saturated fat and cholesterol, caloric balance, sufficient intake of fresh fruits, vegetables, and fiber.  -Stressed the importance of regular exercise.   -Substance Abuse: Discussed cessation/primary prevention of tobacco, alcohol, or other drug use; driving or other dangerous activities under the influence; availability of treatment for abuse.   -Injury prevention: Discussed safety belts, safety helmets, smoke detector, smoking near bedding or upholstery.   -Sexuality: Discussed sexually transmitted diseases, partner selection, use of condoms, avoidance of unintended  pregnancy and contraceptive alternatives.   -Dental health: Discussed importance of regular tooth brushing, flossing, and dental visits.  -Health maintenance and immunizations reviewed. Please refer to Health maintenance section.  During the course of the visit the patient was educated and counseled about appropriate screening and preventive services including:        Fall prevention   Nutrition Physical Activity Weight Management Cognition  Return to care in 1 year for next preventative visit.     Subjective:  HPI:  Health Risk Assessment: Patient considers his overall health to be good. He has no difficulty performing the following: . Preparing food and eating . Bathing  . Getting dressed . Using the toilet . Shopping . Managing Finances . Moving around from place to place  He has not had any falls within the past year.   Depression screen PHQ 2/9 08/28/2017  Decreased Interest 0  Down, Depressed, Hopeless 0  PHQ - 2 Score 0    Lifestyle Factors: Diet: No specific diets Exercise: Stays active at home with yard work and other things.   Patient Care Team: Vivi Barrack, MD as PCP - General (Family Medicine)    He has felt more fatigued lately.  Recently had a bout of what was likely DKA a few weeks ago due to pump malfunction.  He has had some associated sore throat that has improved over the last few weeks.  Is also noticed some difficulty with swallowing he thinks is most likely due to the frequent episodes of vomiting had a few weeks ago.  Overall he feels well.  Is concerned about potential memory deficits however does not want to do anything for this at this  time.  His chronic medical conditions are outlined below:  # Dyslipidemia - Currently on lipitor 20mg  daily and tolerating well - ROS: No myalgias  # Hypertension - Currently on lisinopril 10mg  daily and tolerating well - ROS:  No chest pain or shortness of breath  # Hypothyroidism - On levothyoxine  10mcg daily and tolerating well  # Mild cognitive impairment - Was diagnosed by his previous PCP. - Was previously on donepezil but has since stopped as he did not think it was making a difference.  % IDDM  - Currently follows with endocrinology - Dr Denton Lank - On basal-bolus regimen with tresiba and aspart  ROS: Per HPI, otherwise a complete review of systems was negative.   PMH:  The following were reviewed and entered/updated in epic: Past Medical History:  Diagnosis Date  . Diabetes mellitus without complication ([place])   . Hypertension   . Kidney stones    Patient Active Problem List   Diagnosis Date Noted  . Trigger finger 04/09/2018  . Mass of right lower leg 04/09/2018  . Dyslipidemia due to type 1 diabetes mellitus ([place]) 04/09/2018  . Hypertension associated with diabetes ([place]) 04/09/2018  . Mild cognitive impairment 04/09/2018  . Insulin dependent diabetes mellitus ([place]) 04/09/2018  . Type 1 diabetes mellitus with hyperglycemia ([place]) 01/30/2018  . Hypothyroidism 04/01/2017   History reviewed. No pertinent surgical history.  Family History  Problem Relation Age of Onset  . Juvenile Diabetes Son   . Juvenile Diabetes Grandchild   . Prostate cancer Neg Hx   . [place] cancer Neg Hx     Medications- reviewed and updated Current Outpatient Medications  Medication Sig Dispense Refill  . atorvastatin (LIPITOR) 20 MG tablet Take 20 mg by mouth daily.    . diclofenac sodium (VOLTAREN) 1 % GEL Apply 2 g topically to affected area qid 100 g 1  . insulin aspart (NOVOLOG) 100 UNIT/ML injection Use with insulin pump, up to 75 units daily.    Marland Kitchen levothyroxine (SYNTHROID) 75 MCG tablet Take by mouth.    . lidocaine (XYLOCAINE) 2 % solution Use as directed 15 mLs in the mouth or throat as needed for mouth pain. 100 mL 1  . lisinopril (PRINIVIL,ZESTRIL) 10 MG tablet Take 10 mg by mouth daily.     No current facility-administered medications for this visit.      Allergies-reviewed and updated No Known Allergies  Social History   Socioeconomic History  . Marital status: Married    Spouse name: Not on file  . Number of children: Not on file  . Years of education: Not on file  . Highest education level: Not on file  Occupational History  . Not on file  Social Needs  . Financial resource strain: Not on file  . Food insecurity    Worry: Not on file    Inability: Not on file  . Transportation needs    Medical: Not on file    Non-medical: Not on file  Tobacco Use  . Smoking status: Never Smoker  . Smokeless tobacco: Never Used  Substance and Sexual Activity  . Alcohol use: Not Currently    Frequency: Never  . Drug use: Never  . Sexual activity: Not on file  Lifestyle  . Physical activity    Days per week: Not on file    Minutes per session: Not on file  . Stress: Not on file  Relationships  . Social connections    Talks on phone: Not on file  Gets together: Not on file    Attends religious service: Not on file    Active member of club or organization: Not on file    Attends meetings of clubs or organizations: Not on file    Relationship status: Not on file  Other Topics Concern  . Not on file  Social History Narrative  . Not on file        Objective:  Physical Exam: BP 134/77 (BP Location: Left Arm, Patient Position: Sitting, Cuff Size: Normal)   Pulse 63   Temp 97.8 F (36.6 C) (Oral)   Ht 5\' 5"  (1.651 m)   Wt 164 lb 3.2 oz (74.5 kg)   SpO2 99%   BMI 27.32 kg/m   Body mass index is 27.32 kg/m. Wt Readings from Last 3 Encounters:  10/01/18 164 lb 3.2 oz (74.5 kg)  06/02/18 159 lb 6.4 oz (72.3 kg)  04/09/18 157 lb 9.6 oz (71.5 kg)   Gen: NAD, resting comfortably HEENT: TMs normal bilaterally. OP clear. No thyromegaly noted.  CV: RRR with no murmurs appreciated Pulm: NWOB, CTAB with no crackles, wheezes, or rhonchi GI: Normal bowel sounds present. Soft, Nontender, Nondistended. MSK: no edema, cyanosis, or  clubbing noted Skin: warm, dry Neuro: CN2-12 grossly intact. Strength 5/5 in upper and lower extremities. Reflexes symmetric and intact bilaterally. 2/3 word recall on minicog.  Psych: Normal affect and thought content     Caleb M. Jerline Pain, MD 10/01/2018 12:27 PM

## 2018-10-01 NOTE — Assessment & Plan Note (Signed)
Continue management per endocrinology.  Check A1c.

## 2018-10-01 NOTE — Assessment & Plan Note (Signed)
Continue levothyroxine 75 mcg daily.  Check TSH.

## 2018-10-01 NOTE — Patient Instructions (Signed)
It was very nice to see you today!  We will check blood work.  No medication changes.  Please let me know if your throat symptoms do not continue to improve.  Come back in 6 months for your next visit, or sooner as needed.   Take care, Dr Jerline Pain  Please try these tips to maintain a healthy lifestyle:   Eat at least 3 REAL meals and 1-2 snacks per day.  Aim for no more than 5 hours between eating.  If you eat breakfast, please do so within one hour of getting up.    Obtain twice as many fruits/vegetables as protein or carbohydrate foods for both lunch and dinner. (Half of each meal should be fruits/vegetables, one quarter protein, and one quarter starchy carbs)   Cut down on sweet beverages. This includes juice, soda, and sweet tea.    Exercise at least 150 minutes every week.    Preventive Care 4 Years and Older, Male Preventive care refers to lifestyle choices and visits with your health care provider that can promote health and wellness. This includes:  A yearly physical exam. This is also called an annual well check.  Regular dental and eye exams.  Immunizations.  Screening for certain conditions.  Healthy lifestyle choices, such as diet and exercise. What can I expect for my preventive care visit? Physical exam Your health care provider will check:  Height and weight. These may be used to calculate body mass index (BMI), which is a measurement that tells if you are at a healthy weight.  Heart rate and blood pressure.  Your skin for abnormal spots. Counseling Your health care provider may ask you questions about:  Alcohol, tobacco, and drug use.  Emotional well-being.  Home and relationship well-being.  Sexual activity.  Eating habits.  History of falls.  Memory and ability to understand (cognition).  Work and work Statistician. What immunizations do I need?  Influenza (flu) vaccine  This is recommended every year. Tetanus, diphtheria, and  pertussis (Tdap) vaccine  You may need a Td booster every 10 years. Varicella (chickenpox) vaccine  You may need this vaccine if you have not already been vaccinated. Zoster (shingles) vaccine  You may need this after age 66. Pneumococcal conjugate (PCV13) vaccine  One dose is recommended after age 34. Pneumococcal polysaccharide (PPSV23) vaccine  One dose is recommended after age 66. Measles, mumps, and rubella (MMR) vaccine  You may need at least one dose of MMR if you were born in 1957 or later. You may also need a second dose. Meningococcal conjugate (MenACWY) vaccine  You may need this if you have certain conditions. Hepatitis A vaccine  You may need this if you have certain conditions or if you travel or work in places where you may be exposed to hepatitis A. Hepatitis B vaccine  You may need this if you have certain conditions or if you travel or work in places where you may be exposed to hepatitis B. Haemophilus influenzae type b (Hib) vaccine  You may need this if you have certain conditions. You may receive vaccines as individual doses or as more than one vaccine together in one shot (combination vaccines). Talk with your health care provider about the risks and benefits of combination vaccines. What tests do I need? Blood tests  Lipid and cholesterol levels. These may be checked every 5 years, or more frequently depending on your overall health.  Hepatitis C test.  Hepatitis B test. Screening  Lung cancer screening. You  may have this screening every year starting at age 64 if you have a 30-pack-year history of smoking and currently smoke or have quit within the past 15 years.  Colorectal cancer screening. All adults should have this screening starting at age 84 and continuing until age 75. Your health care provider may recommend screening at age 45 if you are at increased risk. You will have tests every 1-10 years, depending on your results and the type of  screening test.  Prostate cancer screening. Recommendations will vary depending on your family history and other risks.  Diabetes screening. This is done by checking your blood sugar (glucose) after you have not eaten for a while (fasting). You may have this done every 1-3 years.  Abdominal aortic aneurysm (AAA) screening. You may need this if you are a current or former smoker.  Sexually transmitted disease (STD) testing. Follow these instructions at home: Eating and drinking  Eat a diet that includes fresh fruits and vegetables, whole grains, lean protein, and low-fat dairy products. Limit your intake of foods with high amounts of sugar, saturated fats, and salt.  Take vitamin and mineral supplements as recommended by your health care provider.  Do not drink alcohol if your health care provider tells you not to drink.  If you drink alcohol: ? Limit how much you have to 0-2 drinks a day. ? Be aware of how much alcohol is in your drink. In the [place] one drink equals one 12 oz bottle of beer (355 mL), one 5 oz glass of wine (148 mL), or one 1 oz glass of hard liquor (44 mL). Lifestyle  Take daily care of your teeth and gums.  Stay active. Exercise for at least 30 minutes on 5 or more days each week.  Do not use any products that contain nicotine or tobacco, such as cigarettes, e-cigarettes, and chewing tobacco. If you need help quitting, ask your health care provider.  If you are sexually active, practice safe sex. Use a condom or other form of protection to prevent STIs (sexually transmitted infections).  Talk with your health care provider about taking a low-dose aspirin or statin. What's next?  Visit your health care provider once a year for a well check visit.  Ask your health care provider how often you should have your eyes and teeth checked.  Stay up to date on all vaccines. This information is not intended to replace advice given to you by your health care provider.  Make sure you discuss any questions you have with your health care provider. Document Released: 03/31/2015 Document Revised: 02/26/2018 Document Reviewed: 02/26/2018 Elsevier Patient Education  2020 [employer].

## 2018-10-01 NOTE — Assessment & Plan Note (Signed)
At goal  Continue lisinopril 10mg daily

## 2018-10-02 LAB — HEPATITIS C ANTIBODY
Hepatitis C Ab: NONREACTIVE
SIGNAL TO CUT-OFF: 0.02 (ref <1.00)

## 2018-10-05 NOTE — Progress Notes (Signed)
Please inform patient of the following:  His A1c is slightly elevated but all of his other labs are NORMAL. Recommend that he follow up with his endocrinologist soon, otherwise he should keep up the good work and we can recheck in a year.  [name]. Jerline Pain, MD 10/05/2018 10:58 PM

## 2018-10-08 ENCOUNTER — Ambulatory Visit: Payer: Medicare HMO | Admitting: Family Medicine

## 2018-10-20 ENCOUNTER — Encounter: Payer: Self-pay | Admitting: Internal Medicine

## 2018-10-27 ENCOUNTER — Other Ambulatory Visit: Payer: Self-pay

## 2018-10-29 ENCOUNTER — Encounter: Payer: Self-pay | Admitting: Internal Medicine

## 2018-10-29 ENCOUNTER — Other Ambulatory Visit: Payer: Self-pay

## 2018-10-29 ENCOUNTER — Ambulatory Visit: Payer: Medicare HMO | Admitting: Internal Medicine

## 2018-10-29 VITALS — BP 138/78 | HR 64 | Temp 97.9°F | Ht 65.0 in | Wt 166.4 lb

## 2018-10-29 DIAGNOSIS — E1065 Type 1 diabetes mellitus with hyperglycemia: Secondary | ICD-10-CM

## 2018-10-29 NOTE — Patient Instructions (Addendum)
Pump   T-Slim Settings   Insulin type   NOVOLOG    Basal rate       0000-0900  0.64 u/h    0900-0000  2.0 u/h           I:C ratio       0000-0000 1:6          Sensitivity       0000  40      Goal       0000  120

## 2018-10-29 NOTE — Progress Notes (Signed)
Name: Rodney Miller  MRN/ DOB: [id number], 01/18/1951   Age/ Sex: 68 y.o., male    PCP: Vivi Barrack, MD   Reason for Endocrinology Evaluation: Type 1 Diabetes Mellitus     Date of Initial Endocrinology Visit: 10/29/2018     PATIENT IDENTIFIER: Rodney Miller is a 68 y.o. male with a past medical history of T1DM and Hypothyroidism. The patient presented for initial endocrinology clinic visit on 10/29/2018 for consultative assistance with his diabetes management.    HPI: Rodney Miller was    Diagnosed with T2DM in 2000 , but in 2019 was diagnosed with T1DM (unclear basis of this diagnosis) Prior Medications tried/Intolerance: Metformin, Actos , insulin started 2019, has tried V-Go (hypoglycemia) , Omnipod , has been on T-slim since 2019.  Currently checking blood sugars frequently though Dexcom  Hypoglycemia episodes : Yes        Symptoms: disoriented Hemoglobin A1c has ranged from 7.4 % in 2020, peaking at 8.5% in 2019. Patient required assistance for hypoglycemia: no Patient has required hospitalization within the last 1 year from hyper or hypoglycemia: no  No DKA in the past    Noted BG's low overnight, has to eat at bedtime  In terms of diet, the patient eats 3 meals a day, snacks at bedtime due to fear of hypoglycemia.   Son with T1DM   This patient with type 1 diabetes is treated with T-Slim (insulin pump). During the visit the pump basal and bolus doses were reviewed including carb/insulin rations and supplemental doses. The clinical list was updated. The glucose meter download was reviewed in detail to determine if the current pump settings are providing the best glycemic control without excessive hypoglycemia.  Pump and meter download:    Pump   T-Slim Settings   Insulin type   NOVOLOG    Basal rate       0000-0900  0.8 u/h    0900-0000  2.0 u/h           I:C ratio       0000-0000 1:6          Sensitivity       0000  40  AIT 0000-0000 2.5 hrs  Goal       0000   120                                    Type & Model of Pump: T-Slim Insulin Type: Currently using Novolog   Body mass index is 27.69 kg/m.  PUMP STATISTICS: Average BG: 228  BG Readings: 5.57/ day Average Daily Carbs (g): 86 Average Total Daily Insulin: 53.32 Average Daily Basal: 33.13 (62 %) Average Daily Bolus: 11.96 (22 %)    CONTINUOUS GLUCOSE MONITORING RECORD INTERPRETATION    Dates of Recording: 8/6-8/12/20  Sensor description:Dexcom  Results statistics:   CGM use % of time   Average and SD 168/69  Time in range     62   %  % Time Above 180 35  % Time above 250 35  % Time Below target 3    Glycemic patterns summary: downtrend of glucose overnight, hyperglycemia noted post-prandial , occasional hypoglycemia during the day as well  Hyperglycemic episodes  Post-prandial   Hypoglycemic episodes occurred overnight and post-bolus during the day       HOME DIABETES REGIMEN: T-slim  Dexcom    Statin: Yes ACE-I/ARB: yes Prior Diabetic  Education: yes    DIABETIC COMPLICATIONS: Microvascular complications:   Denies: CKD, retinopathy , neuropathy  Last eye exam: Completed 2020  Macrovascular complications:  Denies: CAD, PVD, CVA   PAST HISTORY: Past Medical History:  Past Medical History:  Diagnosis Date  . Diabetes mellitus without complication ([place])   . Hypertension   . Kidney stones    Past Surgical History: No past surgical history on file.  Social History:  reports that he has never smoked. He has never used smokeless tobacco. He reports previous alcohol use. He reports that he does not use drugs. Family History:  Family History  Problem Relation Age of Onset  . Juvenile Diabetes Son   . Juvenile Diabetes Grandchild   . Prostate cancer Neg Hx   . [place] cancer Neg Hx      HOME MEDICATIONS: Allergies as of 10/29/2018   No Known Allergies     Medication List       Accurate as of October 29, 2018 10:49 AM. If you have any  questions, ask your nurse or doctor.        aspirin EC 81 MG tablet Take 81 mg by mouth daily.   atorvastatin 20 MG tablet Commonly known as: LIPITOR Take 20 mg by mouth daily.   D3 High Potency 125 MCG (5000 UT) capsule Generic drug: Cholecalciferol Take 5,000 Units by mouth daily.   diclofenac sodium 1 % Gel Commonly known as: Voltaren Apply 2 g topically to affected area qid   IRON 27 PO Take by mouth.   levothyroxine 75 MCG tablet Commonly known as: SYNTHROID Take by mouth.   lidocaine 2 % solution Commonly known as: XYLOCAINE Use as directed 15 mLs in the mouth or throat as needed for mouth pain.   lisinopril 10 MG tablet Commonly known as: ZESTRIL Take 10 mg by mouth daily.   NovoLOG 100 UNIT/ML injection Generic drug: insulin aspart Use with insulin pump, up to 75 units daily.   UNABLE TO FIND Med Name: cerebra capsule daily        ALLERGIES: No Known Allergies   REVIEW OF SYSTEMS: A comprehensive ROS was conducted with the patient and is negative except as per HPI and below:  Review of Systems  Genitourinary: Negative for frequency.  Neurological: Negative for tingling and tremors.  Endo/Heme/Allergies: Negative for polydipsia.      OBJECTIVE:   VITAL SIGNS: BP 138/78 (BP Location: Left Arm, Patient Position: Sitting, Cuff Size: Normal)   Pulse 64   Temp 97.9 F (36.6 C)   Ht 5\' 5"  (1.651 m)   Wt 166 lb 6.4 oz (75.5 kg)   SpO2 99%   BMI 27.69 kg/m    PHYSICAL EXAM:  General: Pt appears well and is in NAD  Hydration: Well-hydrated with moist mucous membranes and good skin turgor  HEENT: Head: Unremarkable with good dentition. Oropharynx clear without exudate.  Eyes: External eye exam normal without stare, lid lag or exophthalmos.  EOM intact.  PERRL.  Neck: General: Supple without adenopathy or carotid bruits. Thyroid: Thyroid size normal.  No goiter or nodules appreciated. No thyroid bruit.  Lungs: Clear with good BS bilat with no  rales, rhonchi, or wheezes  Heart: RRR with normal S1 and S2 and no gallops; no murmurs; no rub  Abdomen: Normoactive bowel sounds, soft, nontender, without masses or organomegaly palpable  Extremities:  Lower extremities - No pretibial edema. No lesions.  Skin: Normal texture and temperature to palpation. No rash noted. No Acanthosis nigricans/skin  tags. No lipohypertrophy.  Neuro: MS is good with appropriate affect, pt is alert and Ox3    DM foot exam: 10/29/2018  The skin of the feet is intact without sores or ulcerations. The pedal pulses are 2+ on right and 2+ on left. The sensation is intact to a screening 5.07, 10 gram monofilament bilaterally   DATA REVIEWED:  Lab Results  Component Value Date   HGBA1C 7.4 (H) 10/01/2018   Lab Results  Component Value Date   LDLCALC 62 10/01/2018   CREATININE 1.03 10/01/2018   No results found for: [facility]  Lab Results  Component Value Date   CHOL 126 10/01/2018   HDL 41.80 10/01/2018   LDLCALC 62 10/01/2018   TRIG 109.0 10/01/2018   CHOLHDL 3 10/01/2018        ASSESSMENT / PLAN / RECOMMENDATIONS:   1) Type 1 Diabetes Mellitus, Sub-Optimally controlled, Without complications - Most recent A1c of 7.4 %. Goal A1c < 7.0 %.     GENERAL: Pt with fluctuating BG's between hyperglycemia and hypoglycemia. He tends to drop over night , will reduce his basal  He is on high basal rate during the morning compared to his nightly basal rate, which doesn't make sense, and makes me suspect that he doesn't enter all CHO eaten and underestimates the amount of CHO consumed.  He also tends to bolus half way through the meal which at times, is too late because his glucose is already peaked to 300;s and by the time the insulin is fully on board, he ends up with a hypoglycemic episodes.  He will see our CDE for a refresher on proper pump use and CHO counting    MEDICATIONS: Pump   T-Slim Settings   Insulin type   NOVOLOG    Basal rate        0000-0900  0.64 u/h    0900-0000  2.0 u/h           I:C ratio       0000-0000 1:6          Sensitivity       0000  40  AIT 0000 4 hrs  Goal       0000  120    EDUCATION / INSTRUCTIONS: BG monitoring instructions: Patient is instructed to check his blood sugars 4 times a day, before meals and bedtime Call [facility] if: BG persistently < 70 or > 300. I reviewed the Rule of 15 for the treatment of hypoglycemia in detail with the patient. Literature supplied.   2) Diabetic complications:  Eye: Does not have known diabetic retinopathy.  Neuro/ Feet: Does not have known diabetic peripheral neuropathy. Renal: Patient does not have known baseline CKD. He is on an ACEI/ARB at present.Check urine albumin/creatinine ratio yearly starting at time of diagnosis.    3) Lipids: Patient is on a statin.    4) Hypertension: He is  at goal of < 140/90 mmHg.    Over 45 minutes were spent with the pt, > 50% of the time was spent in counseling and education    Signed electronically by: Mack Guise, MD  [facility]  [facility] [address] [place], [place] [address] Phone: [phone] FAX: [phone]   CC: Vivi Barrack, [address] [place] [address] Phone: [phone]  Fax: [phone]    Return to Endocrinology clinic as below: Future Appointments  Date Time Provider [place]  04/07/2019 10:40 AM Jerline Pain,  [name], MD [facility]

## 2018-10-30 ENCOUNTER — Encounter: Payer: Self-pay | Admitting: Internal Medicine

## 2018-11-02 ENCOUNTER — Telehealth: Payer: Self-pay | Admitting: Nutrition

## 2018-11-02 DIAGNOSIS — E1065 Type 1 diabetes mellitus with hyperglycemia: Secondary | ICD-10-CM

## 2018-11-02 DIAGNOSIS — E1159 Type 2 diabetes mellitus with other circulatory complications: Secondary | ICD-10-CM

## 2018-11-02 NOTE — Telephone Encounter (Signed)
Patient very interested in getting the control IQ for his Tandem pump.  Discussed the importance of putting in the correct amount of carbs into the pump, as well as giving the bolus before the meal, for the control IQ to work properly--discussing things like IOB and resulting blood sugar rises when not bolusing until after the meal, and the delayed insulin response, for the control IQ to give the right amount of insulin when blood sugar is going high.  He reported good understanding of this.   He was told that we have a dietitian to help him with carb counting, or he can download the Calorie [employer] app for his phone to help with this.  He said that he will make an appointment with the dietitian for a review of this once he takes the test for the control IQ We discussed the steps needed to update his pump, and he reports having a T-connect account for this.  He was told that it will take 48 hours once we fax the prescription for this.  He reported good understanding of this and had no final questions.

## 2018-11-03 NOTE — Addendum Note (Signed)
Addended by: Dorita Sciara on: 11/03/2018 12:16 PM   Modules accepted: Orders

## 2018-11-12 ENCOUNTER — Encounter: Payer: Self-pay | Admitting: Internal Medicine

## 2018-11-12 ENCOUNTER — Other Ambulatory Visit: Payer: Self-pay | Admitting: Internal Medicine

## 2018-11-12 ENCOUNTER — Other Ambulatory Visit: Payer: Self-pay

## 2018-11-12 MED ORDER — DEXCOM G6 TRANSMITTER MISC: 1.0000 | 3 refills | Status: DC

## 2018-11-12 MED ORDER — DEXCOM G6 SENSOR MISC: 1.0000 | 11 refills | Status: DC

## 2018-11-18 ENCOUNTER — Other Ambulatory Visit: Payer: Self-pay

## 2018-11-18 ENCOUNTER — Encounter: Payer: Medicare HMO | Attending: Internal Medicine | Admitting: Nutrition

## 2018-11-18 DIAGNOSIS — Z794 Long term (current) use of insulin: Secondary | ICD-10-CM | POA: Diagnosis present

## 2018-11-18 DIAGNOSIS — IMO0001 Reserved for inherently not codable concepts without codable children: Secondary | ICD-10-CM

## 2018-11-18 DIAGNOSIS — E119 Type 2 diabetes mellitus without complications: Secondary | ICD-10-CM | POA: Diagnosis present

## 2018-11-19 ENCOUNTER — Other Ambulatory Visit: Payer: Self-pay

## 2018-11-19 MED ORDER — DEXCOM G6 SENSOR MISC: 1.0000 | 11 refills | Status: DC

## 2018-11-19 MED ORDER — DEXCOM G6 TRANSMITTER MISC: 1.0000 | 3 refills | Status: DC

## 2018-11-19 NOTE — Progress Notes (Signed)
Patient experienced a bent cannula on his pump and went into ketoacidosis.  He reports never having been trained in high blood sugar protocol.   We reviewed the steps for this and he was given a sheet with the steps to take when this happens.  He was also shown some needle infusion sets and seemed interested in trying these.  I will get him some samples to try. He also reports that blood sugars go high now after bolusing.  He is only using his abdomen for needle insertions and hypertrophy is present.  Explained that scar tissue has developed and that blood flow to that area is not consistant, resulting high blood sugars and then sudden drops.  He reports this to be the case.  He was given a handout of different sites to use and a suggestion to nightly massage these areas for 20 min. With a lotion to help break up this tissue, and warned that this may take weeks and even months ro resolve.  He agreed to try this.   We also reviewed the steps to control IQ.  Script was sent for this and he is aware he can begin the training, and the steps needed to do this, and how to set up his pump afterwards.  He had no final questions.

## 2018-11-19 NOTE — Patient Instructions (Signed)
Read over high blood sugar protocol given and call if questions Use alternate infusion set sites like upper buttocks and sides of hips.   Go to t-connect website to upgrade pump to [employer]

## 2018-11-25 ENCOUNTER — Encounter: Payer: Self-pay | Admitting: Family Medicine

## 2018-11-26 ENCOUNTER — Other Ambulatory Visit: Payer: Self-pay

## 2018-11-26 MED ORDER — LEVOTHYROXINE SODIUM 75 MCG PO TABS: 75.0000 ug | ORAL_TABLET | Freq: Every day | ORAL | 0 refills | Status: DC

## 2018-11-26 MED ORDER — ATORVASTATIN CALCIUM 20 MG PO TABS: 20.0000 mg | ORAL_TABLET | Freq: Every day | ORAL | 1 refills | Status: DC

## 2018-11-26 MED ORDER — LISINOPRIL 10 MG PO TABS: 10.0000 mg | ORAL_TABLET | Freq: Every day | ORAL | 1 refills | Status: DC

## 2018-12-01 ENCOUNTER — Telehealth: Payer: Self-pay | Admitting: Nutrition

## 2018-12-01 NOTE — Telephone Encounter (Signed)
Patient was told that I have left some needle infusion sets, with directions for use, up front at the checkout for him to try.  He agreed to come and try them.

## 2018-12-25 ENCOUNTER — Other Ambulatory Visit: Payer: Self-pay | Admitting: Family Medicine

## 2018-12-25 MED ORDER — LEVOTHYROXINE SODIUM 75 MCG PO TABS: 75.0000 ug | ORAL_TABLET | Freq: Every day | ORAL | 0 refills | Status: DC

## 2019-01-24 ENCOUNTER — Other Ambulatory Visit: Payer: Self-pay | Admitting: Family Medicine

## 2019-01-25 MED ORDER — LEVOTHYROXINE SODIUM 75 MCG PO TABS: 75.0000 ug | ORAL_TABLET | Freq: Every day | ORAL | 0 refills | Status: DC

## 2019-02-27 ENCOUNTER — Other Ambulatory Visit: Payer: Self-pay | Admitting: Family Medicine

## 2019-03-26 ENCOUNTER — Other Ambulatory Visit: Payer: Self-pay | Admitting: Family Medicine

## 2019-04-06 ENCOUNTER — Other Ambulatory Visit: Payer: Self-pay

## 2019-04-07 ENCOUNTER — Ambulatory Visit (INDEPENDENT_AMBULATORY_CARE_PROVIDER_SITE_OTHER): Payer: Medicare HMO | Admitting: Family Medicine

## 2019-04-07 ENCOUNTER — Encounter: Payer: Self-pay | Admitting: Family Medicine

## 2019-04-07 VITALS — BP 148/80 | HR 60 | Temp 97.7°F | Ht 65.0 in | Wt 166.2 lb

## 2019-04-07 DIAGNOSIS — E1065 Type 1 diabetes mellitus with hyperglycemia: Secondary | ICD-10-CM

## 2019-04-07 DIAGNOSIS — E039 Hypothyroidism, unspecified: Secondary | ICD-10-CM

## 2019-04-07 DIAGNOSIS — E1159 Type 2 diabetes mellitus with other circulatory complications: Secondary | ICD-10-CM | POA: Diagnosis not present

## 2019-04-07 DIAGNOSIS — I10 Essential (primary) hypertension: Secondary | ICD-10-CM | POA: Diagnosis not present

## 2019-04-07 DIAGNOSIS — M6530 Trigger finger, unspecified finger: Secondary | ICD-10-CM

## 2019-04-07 DIAGNOSIS — I152 Hypertension secondary to endocrine disorders: Secondary | ICD-10-CM

## 2019-04-07 DIAGNOSIS — E1069 Type 1 diabetes mellitus with other specified complication: Secondary | ICD-10-CM | POA: Diagnosis not present

## 2019-04-07 DIAGNOSIS — E785 Hyperlipidemia, unspecified: Secondary | ICD-10-CM

## 2019-04-07 DIAGNOSIS — R21 Rash and other nonspecific skin eruption: Secondary | ICD-10-CM

## 2019-04-07 LAB — COMPREHENSIVE METABOLIC PANEL
ALT: 18 U/L (ref 0–53)
AST: 15 U/L (ref 0–37)
Albumin: 4 g/dL (ref 3.5–5.2)
Alkaline Phosphatase: 70 U/L (ref 39–117)
BUN: 16 mg/dL (ref 6–23)
CO2: 30 mEq/L (ref 19–32)
Calcium: 10.1 mg/dL (ref 8.4–10.5)
Chloride: 105 mEq/L (ref 96–112)
Creatinine, Ser: 1.02 mg/dL (ref 0.40–1.50)
GFR: 72.17 mL/min (ref >60.00)
Glucose, Bld: 163 mg/dL — ABNORMAL HIGH (ref 70–99)
Potassium: 5 mEq/L (ref 3.5–5.1)
Sodium: 140 mEq/L (ref 135–145)
Total Bilirubin: 0.7 mg/dL (ref 0.2–1.2)
Total Protein: 6.5 g/dL (ref 6.0–8.3)

## 2019-04-07 LAB — CBC
HCT: 40.4 % (ref 39.0–52.0)
Hemoglobin: 13.3 g/dL (ref 13.0–17.0)
MCHC: 32.9 g/dL (ref 30.0–36.0)
MCV: 95.9 fl (ref 78.0–100.0)
Platelets: 280 10*3/uL (ref 150.0–400.0)
RBC: 4.22 Mil/uL (ref 4.22–5.81)
RDW: 13.1 % (ref 11.5–15.5)
WBC: 7.3 10*3/uL (ref 4.0–10.5)

## 2019-04-07 LAB — IBC + FERRITIN
Ferritin: 84.2 ng/mL (ref 22.0–322.0)
Iron: 136 ug/dL (ref 42–165)
Saturation Ratios: 49.6 % (ref 20.0–50.0)
Transferrin: 196 mg/dL — ABNORMAL LOW (ref 212.0–360.0)

## 2019-04-07 LAB — HEMOGLOBIN A1C: Hgb A1c MFr Bld: 7.1 % — ABNORMAL HIGH (ref 4.6–6.5)

## 2019-04-07 LAB — TSH: TSH: 1.33 u[IU]/mL (ref 0.35–4.50)

## 2019-04-07 LAB — VITAMIN B12: Vitamin B-12: >1500 pg/mL — ABNORMAL HIGH (ref 211–911)

## 2019-04-07 MED ORDER — DESONIDE 0.05 % EX CREA: TOPICAL_CREAM | Freq: Two times a day (BID) | CUTANEOUS | 0 refills | Status: DC

## 2019-04-07 NOTE — Assessment & Plan Note (Signed)
Continue Lipitor 20 mg daily. 

## 2019-04-07 NOTE — Assessment & Plan Note (Signed)
Will place referral to sports medicine.

## 2019-04-07 NOTE — Assessment & Plan Note (Signed)
Advised patient to follow-up with endocrinology soon.  Will check A1c today as part of blood work.  Continue basal bolus regimen.

## 2019-04-07 NOTE — Patient Instructions (Signed)
It was very nice to see you today!  We will check blood work.  Please use the cream for your legs.  I will place a referral for you to see sports medicine for your finger.  Take care, Dr Jerline Pain  Please try these tips to maintain a healthy lifestyle:   Eat at least 3 REAL meals and 1-2 snacks per day.  Aim for no more than 5 hours between eating.  If you eat breakfast, please do so within one hour of getting up.    Each meal should contain half fruits/vegetables, one quarter protein, and one quarter carbs (no bigger than a computer mouse)   Cut down on sweet beverages. This includes juice, soda, and sweet tea.     Drink at least 1 glass of water with each meal and aim for at least 8 glasses per day   Exercise at least 150 minutes every week.

## 2019-04-07 NOTE — Progress Notes (Signed)
   Rodney Miller is a 69 y.o. male who presents today for an office visit.  Assessment/Plan:  New/Acute Problems: Rash Start topical desonide.  Likely due to xerosis cutis.  Chronic Problems Addressed Today: Type 1 diabetes mellitus with hyperglycemia ([place]) Advised patient to follow-up with endocrinology soon.  Will check A1c today as part of blood work.  Continue basal bolus regimen.  Hypothyroidism Check TSH.  Hypertension associated with diabetes ([place]) Slightly above goal.  Previously well controlled.  Continue lisinopril 10 mg daily.  Check CBC, C met, TSH.  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  Trigger finger Will place referral to sports medicine.      Subjective:  HPI:  Patient has noticed a rash on bilateral legs for the past few weeks.  Is on left thigh but is now progressed to right thigh.  It is red and itchy.  No obvious exposures or other precipitating events.  His stable, chronic medical conditions are outlined below:  # Dyslipidemia - Currently on lipitor '20mg'$  daily and tolerating well - ROS: No myalgias  # Hypertension - Currently on lisinopril '10mg'$  daily and tolerating well - ROS:  No chest pain or shortness of breath  # Hypothyroidism - On levothyoxine 90mg daily and tolerating well  # Mild cognitive impairment - Was diagnosed by his previous PCP. - Was previously on donepezil but has since stopped as he did not think it was making a difference.  % IDDM  - Currently follows with endocrinology - Dr GDenton Lank- On basal-bolus regimen with tresiba and aspart       Objective:  Physical Exam: BP (!) 148/80   Pulse 60   Temp 97.7 F (36.5 C)   Ht '5\' 5"'$  (1.651 m)   Wt 166 lb 4 oz (75.4 kg)   SpO2 99%   BMI 27.67 kg/m   Gen: No acute distress, resting comfortably CV: Regular rate and rhythm with no murmurs appreciated Pulm: Normal work of breathing, clear to auscultation bilaterally with no crackles,  wheezes, or rhonchi Skin: Bilateral upper thighs with xerosis and erythema. Neuro: Grossly normal, moves all extremities Psych: Normal affect and thought content      Brian Kocourek M. PJerline Pain MD 04/07/2019 11:13 AM

## 2019-04-07 NOTE — Assessment & Plan Note (Signed)
Check TSH 

## 2019-04-07 NOTE — Assessment & Plan Note (Signed)
Slightly above goal.  Previously well controlled.  Continue lisinopril 10 mg daily.  Check CBC, C met, TSH.

## 2019-04-08 NOTE — Progress Notes (Signed)
Please inform patient of the following:  Blood work all stable. Would like for him to keep up the good work and we can recheck at his next office visit.  [name]. Jerline Pain, MD 04/08/2019 2:36 PM

## 2019-04-12 ENCOUNTER — Other Ambulatory Visit: Payer: Self-pay

## 2019-04-12 ENCOUNTER — Ambulatory Visit: Payer: Medicare HMO | Admitting: Family Medicine

## 2019-04-12 ENCOUNTER — Ambulatory Visit: Payer: Self-pay

## 2019-04-12 ENCOUNTER — Encounter: Payer: Self-pay | Admitting: Family Medicine

## 2019-04-12 VITALS — BP 150/80 | HR 62 | Ht 65.0 in | Wt 169.0 lb

## 2019-04-12 DIAGNOSIS — M65332 Trigger finger, left middle finger: Secondary | ICD-10-CM | POA: Diagnosis not present

## 2019-04-12 NOTE — Progress Notes (Signed)
   I, Rodney Miller, LAT, ATC, am serving as scribe for Dr. Lynne Miller.  Rodney Miller is a 69 y.o. male who presents to [facility] today for f/u of trigger finger.  Pt was last seen by Dr. Paulla Miller on 06/02/18 for L middle finger and R index finger trigger fingers and had an injection into the L middle finger.  Since his last visit w/ Dr. Paulla Miller, pt reports the L middle finger is unable to close and if tries to it will start to hurt. If he closes it by accident he cannot get it back open. R index finger feel fine, he is able to close it but feels the bones scrapping.    Pertinent review of systems: No fevers or chills.  No recent injury.  Relevant historical information: As above history right third digit trigger finger status post injection with little benefit.   Exam:  BP (!) 150/80 (BP Location: Left Arm, Patient Position: Sitting, Cuff Size: Normal)   Pulse 62   Ht 5\' 5"  (1.651 m)   Wt 169 lb (76.7 kg)   SpO2 99%   BMI 28.12 kg/m  General: Well Developed, well nourished, and in no acute distress.   MSK: Left hand: Normal-appearing.  Third digit triggering and lack of flexion motion present at PIP with pain felt the palmar third MCP. Strength intact to flexion extension within motion. Cap refill pulses and sensation are intact distally.    Lab and Radiology Results  Limited musculoskeletal ultrasound left third digit palmar MCP A1 pulley. Intact flexion tendons.  Increased thickness of A1 pulley seen on ultrasound with mild increase hypoechoic fluid in tendon sheath.  No ganglion cyst or tendon nodule. Normal bony structures. Impression: Third digit trigger finger    Assessment and Plan: 69 y.o. male with right third digit trigger finger ongoing for months.  Patient is already had trial of injection with very little benefit.  At this point he is failing conservative management.  We discussed his treatment options.  Repeat injection would be reasonable  but likely not going to provide significant benefit from previous injection.  At this point totally reasonable to refer to hand surgery for surgical option.  Additionally discussed topical Voltaren gel and double Band-Aid splint.  Recheck as needed.  Referral to Dr. Amedeo Miller hand surgery ordered today.    Orders Placed This Encounter  Procedures  . [place] LIMITED JOINT SPACE STRUCTURES UP LEFT(NO LINKED CHARGES)    Order Specific Question:   Reason for Exam (SYMPTOM  OR DIAGNOSIS REQUIRED)    Answer:   trigger finger left 3rd    Order Specific Question:   Preferred imaging location?    Answer:   [place]  . Ambulatory referral to Orthopedic Surgery    Referral Priority:   Routine    Referral Type:   Surgical    Referral Reason:   Specialty Services Required    Referred to Provider:   Roseanne Kaufman, MD    Requested Specialty:   Orthopedic Surgery    Number of Visits Requested:   1   No orders of the defined types were placed in this encounter.    Discussed warning signs or symptoms. Please see discharge instructions. Patient expresses understanding.   The above documentation has been reviewed and is accurate and complete Rodney Miller

## 2019-04-12 NOTE — Patient Instructions (Signed)
Thank you for coming in today. Use the voltaren gel over the counter.  Do the double bandaid splint.  You should hear from Dr Vanetta Shawl office soon.  Let me know if you do not hear anything.  I am happy to repeat injection if needed.    Trigger Finger  Trigger finger, also called stenosing tenosynovitis,  is a condition that causes a finger to get stuck in a bent position. Each finger has a tendon, which is a tough, cord-like tissue that connects muscle to bone, and each tendon passes through a tunnel of tissue called a tendon sheath. To move your finger, your tendon needs to glide freely through the sheath. Trigger finger happens when the tendon or the sheath thickens, making it difficult to move your finger. Trigger finger can affect any finger or a thumb. It may affect more than one finger. Mild cases may clear up with rest and medicine. Severe cases require more treatment. What are the causes? Trigger finger is caused by a thickened finger tendon or tendon sheath. The cause of this thickening is not known. What increases the risk? The following factors may make you more likely to develop this condition:  Doing activities that require a strong grip.  Having rheumatoid arthritis, gout, or diabetes.  Being 49-74 years old.  Being male. What are the signs or symptoms? Symptoms of this condition include:  Pain when bending or straightening your finger.  Tenderness or swelling where your finger attaches to the palm of your hand.  A lump in the palm of your hand or on the inside of your finger.  Hearing a noise like a pop or a snap when you try to straighten your finger.  Feeling a catching or locking sensation when you try to straighten your finger.  Being unable to straighten your finger. How is this diagnosed? This condition is diagnosed based on your symptoms and a physical exam. How is this treated? This condition may be treated by:  Resting your finger and avoiding  activities that make symptoms worse.  Wearing a finger splint to keep your finger extended.  Taking NSAIDs, such as ibuprofen, to relieve pain and swelling.  Doing gentle exercises to stretch the finger as told by your health care provider.  Having medicine that reduces swelling and inflammation (steroids) injected into the tendon sheath. Injections may need to be repeated.  Having surgery to open the tendon sheath. This may be done if other treatments do not work and you cannot straighten your finger. You may need physical therapy after surgery. Follow these instructions at home: If you have a splint:  Wear the splint as told by your health care provider. Remove it only as told by your health care provider.  Loosen it if your fingers tingle, become numb, or turn cold and blue.  Keep it clean.  If the splint is not waterproof: ? Do not let it get wet. ? Cover it with a watertight covering when you take a bath or shower. Managing pain, stiffness, and swelling     If directed, apply heat to the affected area as often as told by your health care provider. Use the heat source that your health care provider recommends, such as a moist heat pack or a heating pad.  Place a towel between your skin and the heat source.  Leave the heat on for 20-30 minutes.  Remove the heat if your skin turns bright red. This is especially important if you are unable to feel  pain, heat, or cold. You may have a greater risk of getting burned. If directed, put ice on the painful area. To do this:  If you have a removable splint, remove it as told by your health care provider.  Put ice in a plastic bag.  Place a towel between your skin and the bag or between your splint and the bag.  Leave the ice on for 20 minutes, 2-3 times a day.  Activity  Rest your finger as told by your health care provider. Avoid activities that make the pain worse.  Return to your normal activities as told by your health  care provider. Ask your health care provider what activities are safe for you.  Do exercises as told by your health care provider.  Ask your health care provider when it is safe to drive if you have a splint on your hand. General instructions  Take over-the-counter and prescription medicines only as told by your health care provider.  Keep all follow-up visits as told by your health care provider. This is important. Contact a health care provider if:  Your symptoms are not improving with home care. Summary  Trigger finger, also called stenosing tenosynovitis, causes your finger to get stuck in a bent position. This can make it difficult and painful to straighten your finger.  This condition develops when a finger tendon or tendon sheath thickens.  Treatment may include resting your finger, wearing a splint, and taking medicines.  In severe cases, surgery to open the tendon sheath may be needed. This information is not intended to replace advice given to you by your health care provider. Make sure you discuss any questions you have with your health care provider. Document Revised: 07/20/2018 Document Reviewed: 07/20/2018 Elsevier Patient Education  [employer].

## 2019-04-17 ENCOUNTER — Other Ambulatory Visit: Payer: Self-pay | Admitting: Family Medicine

## 2019-05-05 ENCOUNTER — Telehealth: Payer: Self-pay | Admitting: Nutrition

## 2019-05-05 NOTE — Telephone Encounter (Signed)
Prescription for control IQ written on 10/24/18

## 2019-05-17 ENCOUNTER — Other Ambulatory Visit: Payer: Self-pay

## 2019-05-17 MED ORDER — ATORVASTATIN CALCIUM 20 MG PO TABS: ORAL_TABLET | ORAL | 1 refills | Status: DC

## 2019-05-17 MED ORDER — LISINOPRIL 10 MG PO TABS: ORAL_TABLET | ORAL | 1 refills | Status: DC

## 2019-07-14 ENCOUNTER — Encounter: Payer: Self-pay | Admitting: Internal Medicine

## 2019-07-15 ENCOUNTER — Telehealth: Payer: Self-pay

## 2019-07-15 NOTE — Telephone Encounter (Signed)
Patient is needing Dexcom sensors sent to [facility].  Patient is currently out of the sensor for 2 days.

## 2019-07-16 ENCOUNTER — Other Ambulatory Visit: Payer: Self-pay

## 2019-07-16 MED ORDER — DEXCOM G6 SENSOR MISC: 1.0000 | 11 refills | Status: DC

## 2019-07-16 NOTE — Telephone Encounter (Signed)
Patient returned phone call 07/16/2019 @ 3:50 PM.  Patient advised that nurse was currently unavailable and that I would send a note back for a return call.  Patient requested a call back when possible

## 2019-07-16 NOTE — Telephone Encounter (Signed)
I have attempted to contact pt 3 times. Will close out this phone note until I hear from pt.

## 2019-07-16 NOTE — Telephone Encounter (Signed)
Lft vm for pt to return call to inform him that rx was sent with 11 refills 11/2018 and that he should contact pharmacy.

## 2019-07-23 ENCOUNTER — Other Ambulatory Visit: Payer: Self-pay

## 2019-07-27 ENCOUNTER — Ambulatory Visit: Payer: Medicare HMO | Admitting: Internal Medicine

## 2019-07-27 ENCOUNTER — Encounter: Payer: Self-pay | Admitting: Internal Medicine

## 2019-07-27 ENCOUNTER — Other Ambulatory Visit: Payer: Self-pay

## 2019-07-27 VITALS — BP 156/78 | HR 68 | Temp 98.7°F | Ht 65.0 in | Wt 166.0 lb

## 2019-07-27 DIAGNOSIS — E1065 Type 1 diabetes mellitus with hyperglycemia: Secondary | ICD-10-CM

## 2019-07-27 LAB — POCT GLYCOSYLATED HEMOGLOBIN (HGB A1C): Hemoglobin A1C: 7.1 % — AB (ref 4.0–5.6)

## 2019-07-27 MED ORDER — DEXCOM G6 TRANSMITTER MISC: 1.0000 | 3 refills | Status: DC

## 2019-07-27 MED ORDER — DEXCOM G6 SENSOR MISC: 1.0000 | 3 refills | Status: DC

## 2019-07-27 NOTE — Progress Notes (Signed)
Name: Rodney Miller  Age/ Sex: 69 y.o., male   MRN/ DOB: [id number], [birth year]     PCP: Vivi Barrack, MD   Reason for Endocrinology Evaluation: Type 1 Diabetes Mellitus  Initial Endocrine Consultative Visit: 10/29/2018    PATIENT IDENTIFIER: Rodney Miller is a 69 y.o. male with a past medical history of T1DM and Hypothyroidism. The patient has followed with Endocrinology clinic since 10/29/2019 for consultative assistance with management of his diabetes.  DIABETIC HISTORY:  Rodney Miller was diagnosed with T2DM in 2000, but in 2019 was diagnosed with T1DM (unclear basis of this diagnosis). He has been on Metformin,and  Actos in the past , was started on insulin in 2019. Started the T-slim in 2019.  His hemoglobin A1c has ranged from 7.4 % in 2020, peaking at 8.5% in 2019.   SUBJECTIVE:   During the last visit (10/29/2018): adjusted pump settings  Today (07/27/2019): Rodney Miller is here for a follow up on diabetes management and getting Dexcom sensor refills. He checks his blood sugars multiple times daily, through the CGM. The patient has had hypoglycemic episodes since the last clinic visit, which typically occur rarely and overnight.   PUMP SETTINGS & DOWNLOAD   Pump   T-Slim Settings   Insulin type   NOVOLOG    Basal rate       0000-0900  0.700 u/h    0900-0000  1.7 u/h           I:C ratio       0000-0900 1:40   (prevously 1:6)   0900-0000 1:50      Sensitivity       0000  50 (previously 40)  AIT 0000 2 hrs ( previously 4 hrs)  Goal       0000  110     Total 24-hour basal: 36.3   CURRENT PUMP STATISTICS: Average BG: 161 (dashboard 244 ) BG Readings: 199  Average Daily Carbs (g): 72  Average Daily Basal: 33 (72 %) Average Daily Bolus: 13 (28 %)     HOME DIABETES REGIMEN:  Novolog T-Slim  Statin: Yes ACE-I/ARB: Yes    METER DOWNLOAD SUMMARY: Date range evaluated: 4/28-5/01/2020 Fingerstick Blood Glucose Tests = 80 Average  Number Tests/Day = 2.7 Overall Mean FS Glucose = 235 Standard Deviation = 73.5  BG Ranges: Low = 92 High = 437   Hypoglycemic Events/30 Days: BG < 50 = 0 Episodes of symptomatic severe hypoglycemia = 0    Statin: Yes ACE-I/ARB: yes Prior Diabetic Education: yes    DIABETIC COMPLICATIONS: Microvascular complications:   Denies: CKD, retinopathy , neuropathy  Last eye exam: Completed 2020  Macrovascular complications:  Denies: CAD, PVD, CVA  HISTORY:  Past Medical History:  Past Medical History:  Diagnosis Date  . Diabetes mellitus without complication ([place])   . Hypertension   . Kidney stones    Past Surgical History: No past surgical history on file. Social History:  reports that he has never smoked. He has never used smokeless tobacco. He reports previous alcohol use. He reports that he does not use drugs. Family History:  Family History  Problem Relation Age of Onset  . Juvenile Diabetes Son   . Juvenile Diabetes Grandchild   . Prostate cancer Neg Hx   . Damiani cancer Neg Hx      HOME MEDICATIONS: Allergies as of 07/27/2019   No Known Allergies     Medication List       Accurate as of Jul 27, 2019  9:41 AM. If you have any questions, ask your nurse or doctor.        ascorbic acid 500 MG tablet Commonly known as: VITAMIN C Take 500 mg by mouth daily.   aspirin EC 81 MG tablet Take 81 mg by mouth daily.   atorvastatin 20 MG tablet Commonly known as: LIPITOR TAKE 1 TABLET(20 MG) BY MOUTH DAILY   D3 High Potency 125 MCG (5000 UT) capsule Generic drug: Cholecalciferol Take 5,000 Units by mouth daily.   desonide 0.05 % cream Commonly known as: DESOWEN Apply topically 2 (two) times daily.   Dexcom G6 Sensor Misc 1 Device by Does not apply route as directed.   Dexcom G6 Transmitter Misc 1 Device by Does not apply route as directed.   donepezil 5 MG tablet Commonly known as: ARICEPT TAKE 1 TABLET EVERY DAY AT BEDTIME   IRON 27  PO Take by mouth.   levothyroxine 75 MCG tablet Commonly known as: SYNTHROID TAKE 1 TABLET(75 MCG) BY MOUTH DAILY BEFORE BREAKFAST   lisinopril 10 MG tablet Commonly known as: ZESTRIL TAKE 1 TABLET(10 MG) BY MOUTH DAILY   NovoLOG 100 UNIT/ML injection Generic drug: insulin aspart Use with insulin pump, up to 75 units daily.   T:slim Insulin Pump Devi by Does not apply route.   UNABLE TO FIND Med Name: cerebra capsule daily        OBJECTIVE:   Vital Signs: BP (!) 156/78 (BP Location: Left Arm, Patient Position: Sitting, Cuff Size: Large)   Pulse 68   Temp 98.7 F (37.1 C)   Ht 5\' 5"  (1.651 m)   Wt 166 lb (75.3 kg)   SpO2 99%   BMI 27.62 kg/m   Wt Readings from Last 3 Encounters:  07/27/19 166 lb (75.3 kg)  04/12/19 169 lb (76.7 kg)  04/07/19 166 lb 4 oz (75.4 kg)     Exam: General: Pt appears well and is in NAD  Lungs: Clear with good BS bilat with no rales, rhonchi, or wheezes  Heart: RRR   Extremities: No pretibial edema.  Neuro: MS is good with appropriate affect, pt is alert and Ox3      DATA REVIEWED:  Lab Results  Component Value Date   HGBA1C 7.1 (A) 07/27/2019   HGBA1C 7.1 (H) 04/07/2019   HGBA1C 7.4 (H) 10/01/2018   Lab Results  Component Value Date   LDLCALC 62 10/01/2018   CREATININE 1.02 04/07/2019     Lab Results  Component Value Date   CHOL 126 10/01/2018   HDL 41.80 10/01/2018   LDLCALC 62 10/01/2018   TRIG 109.0 10/01/2018   CHOLHDL 3 10/01/2018         ASSESSMENT / PLAN / RECOMMENDATIONS:   1) Type 1 Diabetes Mellitus, With Glucose Variability,Without complications - Most recent A1c of 7.1 %. Goal A1c < 7.0 %.      -Despite his seemingly acceptable A1c of 7.1% , his BG' s fluctuate tremendously through the day ranging from 92- > 300 mg/dL on one day. This is because he mainly depends on basal rate and corrections provided by the IQ of the T-slim, he rarely enters carbohydrates eaten during the day , most of the days  there are not CHO entered at all or entered once , despite eating 3 meals a day. I suspect he is on too much basal.  - He also had not been seen in ~ 9 months and over this period of time he had made adjustments to  his pump setting ( I: C ratio from 1:6 to 1: 40 and 50 , AIT from 4 hr to 2 hrs etc)  - I again counseled him about entering carbs every time he eats , to prevent glucose variability.  - I am going to adjust his pump settings for now as below   MEDICATIONS: Novolog    Pump   T-Slim Settings   Insulin type   NOVOLOG    Basal rate       0000-0900  0.700 u/h    0900-0000  1.7 u/h           I:C ratio       0000 1:15          Sensitivity       0000  50       AIT 0000 4 hrs  Goal       0000  100- 110      EDUCATION / INSTRUCTIONS: BG monitoring instructions: Patient is instructed to check his blood sugars 4 times a day, before meals and bedtime . Call [facility] if: BG persistently < 70  I reviewed the Rule of 15 for the treatment of hypoglycemia in detail with the patient. Literature supplied.     2) Diabetic complications:  Eye: Does not have known diabetic retinopathy.  Neuro/ Feet: Does not have known diabetic peripheral neuropathy .  Renal: Patient does not have known baseline CKD. He   is  on an ACEI/ARB at present.     F/U in 4 months    Signed electronically by: Mack Guise, MD  [facility] Endocrin[facility]  [address] [place], [place] [address] Phone: [phone] FAX: [phone]   CC: Vivi Barrack, [place] [address] Phone: [phone]  Fax: [phone]  Return to Endocrinology clinic as below: Future Appointments  Date Time Provider [place]  04/11/2020  9:40 AM Vivi Barrack, MD [facility]

## 2019-08-31 ENCOUNTER — Telehealth: Payer: Self-pay | Admitting: Family Medicine

## 2019-08-31 ENCOUNTER — Other Ambulatory Visit: Payer: Self-pay

## 2019-08-31 MED ORDER — LEVOTHYROXINE SODIUM 75 MCG PO TABS: 75.0000 ug | ORAL_TABLET | Freq: Every day | ORAL | 0 refills | Status: DC

## 2019-08-31 MED ORDER — LISINOPRIL 10 MG PO TABS: ORAL_TABLET | ORAL | 0 refills | Status: DC

## 2019-08-31 MED ORDER — ATORVASTATIN CALCIUM 20 MG PO TABS: ORAL_TABLET | ORAL | 0 refills | Status: DC

## 2019-08-31 NOTE — Telephone Encounter (Signed)
MEDICATION: levothyroxine (SYNTHROID) 75 MCG tablet  lisinopril (ZESTRIL) 10 MG tablet  atorvastatin (LIPITOR) 20 MG tablet    PHARMACY: [facility] [address], [place] Phone:  [phone]  Fax:  [phone]      Comments:   **Let patient know to contact pharmacy at the end of the day to make sure medication is ready. **  ** Please notify patient to allow 48-72 hours to process**  **Encourage patient to contact the pharmacy for refills or they can request refills through [facility]**

## 2019-08-31 NOTE — Telephone Encounter (Signed)
Rx sent 

## 2019-09-10 ENCOUNTER — Telehealth: Payer: Self-pay | Admitting: Family Medicine

## 2019-09-10 ENCOUNTER — Other Ambulatory Visit: Payer: Self-pay

## 2019-09-10 MED ORDER — INSULIN ASPART 100 UNIT/ML ~~LOC~~ SOLN: SUBCUTANEOUS | 2 refills | Status: DC

## 2019-09-10 NOTE — Telephone Encounter (Signed)
Rx sent 

## 2019-09-10 NOTE — Telephone Encounter (Signed)
MEDICATION: Novolog 100 unit/ML  PHARMACY: [facility]  Comments:   **Let patient know to contact pharmacy at the end of the day to make sure medication is ready. **  ** Please notify patient to allow 48-72 hours to process**  **Encourage patient to contact the pharmacy for refills or they can request refills through [facility]**

## 2019-10-08 ENCOUNTER — Telehealth: Payer: Self-pay | Admitting: Family Medicine

## 2019-10-08 NOTE — Telephone Encounter (Signed)
Left message for patient to call back and schedule Medicare Annual Wellness Visit (AWV) either virtually/audio only OR in office. Whatever the patients preference is.  Last AWV 10/01/18; please schedule at anytime with [facility] Health Advisor at [facility].  This should be a 45 minute visit.

## 2019-10-15 ENCOUNTER — Telehealth: Payer: Self-pay | Admitting: Internal Medicine

## 2019-10-15 NOTE — Telephone Encounter (Signed)
Patient called re: Patient is getting treatment from a different Provider for Endocrinology.

## 2019-10-15 NOTE — Telephone Encounter (Signed)
fyi

## 2019-10-20 ENCOUNTER — Other Ambulatory Visit: Payer: Self-pay | Admitting: Family Medicine

## 2019-10-20 NOTE — Telephone Encounter (Signed)
I have updated the chart.  

## 2019-11-14 ENCOUNTER — Other Ambulatory Visit: Payer: Self-pay | Admitting: Family Medicine

## 2019-11-19 ENCOUNTER — Telehealth: Payer: Self-pay | Admitting: *Deleted

## 2019-11-19 NOTE — Telephone Encounter (Signed)
PA Case ID: [id number]  Rx Insulin Aspart 100unit/ml  Send to insurance, awaiting for determination

## 2019-11-25 ENCOUNTER — Other Ambulatory Visit: Payer: Self-pay | Admitting: Family Medicine

## 2019-11-29 ENCOUNTER — Encounter: Payer: Self-pay | Admitting: Family Medicine

## 2019-12-03 ENCOUNTER — Ambulatory Visit: Payer: Medicare HMO | Admitting: Internal Medicine

## 2019-12-03 NOTE — Telephone Encounter (Signed)
PA Rx was denied

## 2019-12-06 NOTE — Telephone Encounter (Signed)
Please let patient know - are there any alternatives that his insurance will cover?  Rodney Miller. Jerline Pain, MD 12/06/2019 8:06 AM

## 2019-12-06 NOTE — Telephone Encounter (Signed)
Called patient, patient wife stated Rx was send by endocrinology with the pomp.  Insurance requesting with out, Rx was resend by endocrinology.

## 2019-12-13 ENCOUNTER — Other Ambulatory Visit: Payer: Self-pay | Admitting: Family Medicine

## 2019-12-23 ENCOUNTER — Other Ambulatory Visit: Payer: Self-pay | Admitting: *Deleted

## 2019-12-27 ENCOUNTER — Other Ambulatory Visit: Payer: Self-pay

## 2019-12-27 ENCOUNTER — Ambulatory Visit (INDEPENDENT_AMBULATORY_CARE_PROVIDER_SITE_OTHER): Payer: Medicare HMO | Admitting: Family Medicine

## 2019-12-27 ENCOUNTER — Encounter: Payer: Self-pay | Admitting: Family Medicine

## 2019-12-27 VITALS — BP 137/74 | HR 60 | Temp 98.0°F | Ht 65.0 in | Wt 165.8 lb

## 2019-12-27 DIAGNOSIS — M79641 Pain in right hand: Secondary | ICD-10-CM

## 2019-12-27 DIAGNOSIS — R5383 Other fatigue: Secondary | ICD-10-CM

## 2019-12-27 DIAGNOSIS — E538 Deficiency of other specified B group vitamins: Secondary | ICD-10-CM | POA: Insufficient documentation

## 2019-12-27 DIAGNOSIS — G3184 Mild cognitive impairment, so stated: Secondary | ICD-10-CM | POA: Diagnosis not present

## 2019-12-27 MED ORDER — MELOXICAM 15 MG PO TABS: 15.0000 mg | ORAL_TABLET | Freq: Every day | ORAL | 0 refills | Status: DC

## 2019-12-27 NOTE — Assessment & Plan Note (Signed)
Currently on aricept but is not sure if it helping. Will place referral to neurology.

## 2019-12-27 NOTE — Patient Instructions (Signed)
It was very nice to see you today!  You have arthritis in your hand.  Please take the meloxicam.  Let me know if not improving in the next 1 to 2 weeks.  We will check a urine sample to make sure that you are not having any kidney issues.  We will check blood work today.  I will place a referral for you to see the neurologist to discuss your memory issues.  Take care, Dr Jerline Pain  Please try these tips to maintain a healthy lifestyle:   Eat at least 3 REAL meals and 1-2 snacks per day.  Aim for no more than 5 hours between eating.  If you eat breakfast, please do so within one hour of getting up.    Each meal should contain half fruits/vegetables, one quarter protein, and one quarter carbs (no bigger than a computer mouse)   Cut down on sweet beverages. This includes juice, soda, and sweet tea.     Drink at least 1 glass of water with each meal and aim for at least 8 glasses per day   Exercise at least 150 minutes every week.

## 2019-12-27 NOTE — Assessment & Plan Note (Signed)
Check B12 level today.  

## 2019-12-27 NOTE — Progress Notes (Signed)
   Rodney Miller is a 69 y.o. male who presents today for an office visit.  Assessment/Plan:  New/Acute Problems: Hand Pain Consistent with osteoarthritis. PAtient has seen hand surgeon in the past for trigger finger.  Does not want aggressive therapy at this time.  We will try meloxicam.  He will continue using Voltaren and compression sleeve.  He will let me know if not improving.  Fatigue Likely multifactorial. Will check labs today including CBC, CMET, TSH, B12,  and UA. Would consider sleep study if labs are normal.   Back Pain Likely osteoarthritis.  Will check UA to rule out kidney stone per patient request.  Chronic Problems Addressed Today: Mild cognitive impairment Currently on aricept but is not sure if it helping. Will place referral to neurology.   B12 deficiency Check B12 level today.     Subjective:  HPI:  Patient with right thumb and hand pain for the past couple of months.  Has been doing more manual labor at home including using a hammer drill.  Has tried Voltaren with some improvement.  No numbness.  No injuries.  He also has a few other chronic conditions he would like to discuss today.   See A/p for status of chronic conditions.        Objective:  Physical Exam: BP 137/74   Pulse 60   Temp 98 F (36.7 C) (Temporal)   Ht 5\' 5"  (1.651 m)   Wt 165 lb 12.8 oz (75.2 kg)   SpO2 100%   BMI 27.59 kg/m   Gen: No acute distress, resting comfortably CV: Regular rate and rhythm with no murmurs appreciated Pulm: Normal work of breathing, clear to auscultation bilaterally with no crackles, wheezes, or rhonchi MSK: Right hand without obvious deformity.  Finkelstein negative.  Tender palpation at the right [facility] joint. Neuro: Grossly normal, moves all extremities Psych: Normal affect and thought content      Rodney Miller M. Jerline Pain, MD 12/27/2019 3:43 PM

## 2019-12-28 LAB — COMPREHENSIVE METABOLIC PANEL
AG Ratio: 1.6 (calc) (ref 1.0–2.5)
ALT: 15 U/L (ref 9–46)
AST: 17 U/L (ref 10–35)
Albumin: 4.1 g/dL (ref 3.6–5.1)
Alkaline phosphatase (APISO): 63 U/L (ref 35–144)
BUN: 16 mg/dL (ref 7–25)
CO2: 29 mmol/L (ref 20–32)
Calcium: 9.4 mg/dL (ref 8.6–10.3)
Chloride: 106 mmol/L (ref 98–110)
Creat: 1.08 mg/dL (ref 0.70–1.18)
Globulin: 2.5 g/dL (calc) (ref 1.9–3.7)
Glucose, Bld: 115 mg/dL — ABNORMAL HIGH (ref 65–99)
Potassium: 4.6 mmol/L (ref 3.5–5.3)
Sodium: 140 mmol/L (ref 135–146)
Total Bilirubin: 0.6 mg/dL (ref 0.2–1.2)
Total Protein: 6.6 g/dL (ref 6.1–8.1)

## 2019-12-28 LAB — CBC
HCT: 39.7 % (ref 38.5–50.0)
Hemoglobin: 13.1 g/dL — ABNORMAL LOW (ref 13.2–17.1)
MCH: 31.4 pg (ref 27.0–33.0)
MCHC: 33 g/dL (ref 32.0–36.0)
MCV: 95.2 fL (ref 80.0–100.0)
MPV: 10.4 fL (ref 7.5–12.5)
Platelets: 300 10*3/uL (ref 140–400)
RBC: 4.17 10*6/uL — ABNORMAL LOW (ref 4.20–5.80)
RDW: 11.8 % (ref 11.0–15.0)
WBC: 6.9 10*3/uL (ref 3.8–10.8)

## 2019-12-28 LAB — URINALYSIS, ROUTINE W REFLEX MICROSCOPIC
Bacteria, UA: NONE SEEN /HPF
Bilirubin Urine: NEGATIVE
Glucose, UA: NEGATIVE
Hgb urine dipstick: NEGATIVE
Hyaline Cast: NONE SEEN /LPF
Ketones, ur: NEGATIVE
Leukocytes,Ua: NEGATIVE
Nitrite: NEGATIVE
RBC / HPF: NONE SEEN /HPF (ref 0–2)
Specific Gravity, Urine: 1.017 (ref 1.001–1.03)
Squamous Epithelial / HPF: NONE SEEN /HPF (ref <=5)
WBC, UA: NONE SEEN /HPF (ref 0–5)
pH: 7.5 (ref 5.0–8.0)

## 2019-12-28 LAB — FERRITIN: Ferritin: 78 ng/mL (ref 24–380)

## 2019-12-28 LAB — TSH: TSH: 1.21 mIU/L (ref 0.40–4.50)

## 2019-12-28 LAB — VITAMIN B12: Vitamin B-12: 1282 pg/mL — ABNORMAL HIGH (ref 200–1100)

## 2019-12-28 NOTE — Progress Notes (Signed)
Please inform patient of the following:  Labs are all NORMAL. No explanation for his fatigue. Would like for him to let him know if he is still having ongoing issues and we can discuss sleep study.  [name]. Jerline Pain, MD 12/28/2019 3:39 PM

## 2019-12-29 ENCOUNTER — Telehealth: Payer: Self-pay

## 2019-12-29 NOTE — Telephone Encounter (Signed)
Pt called stating he took the medication that was prescribed to him the other day - Meloxicam. Pt states he woke up this morning with bad vertigo. Pt asked if he should continue taking the medication or maybe take half of the dosage. Please advise.

## 2019-12-29 NOTE — Telephone Encounter (Signed)
See below

## 2019-12-30 NOTE — Telephone Encounter (Signed)
LM to advise on below. Asked patient to call back office.

## 2019-12-30 NOTE — Telephone Encounter (Signed)
He can take half a dose. Would like for him to let us know if symptoms are not improving.  [name]. Jerline Pain, MD 12/30/2019 1:06 PM

## 2019-12-31 ENCOUNTER — Encounter: Payer: Self-pay | Admitting: Neurology

## 2020-01-05 ENCOUNTER — Other Ambulatory Visit: Payer: Self-pay

## 2020-01-05 ENCOUNTER — Telehealth: Payer: Self-pay | Admitting: *Deleted

## 2020-01-05 MED ORDER — MELOXICAM 7.5 MG PO TABS: 7.5000 mg | ORAL_TABLET | Freq: Every day | ORAL | 0 refills | Status: DC

## 2020-01-05 NOTE — Telephone Encounter (Signed)
Mobic 7.5Mg  sent in.

## 2020-01-05 NOTE — Telephone Encounter (Signed)
Patient stated stopped Rx Mobic due to dizziness  Requesting a lower dose or medicine change is is possible  Rx can be send to [facility]

## 2020-01-05 NOTE — Telephone Encounter (Signed)
We can send in 7.5mg  tablet with instruction to take half a pill daily.  [name]. Jerline Pain, MD 01/05/2020 11:46 AM

## 2020-01-11 ENCOUNTER — Telehealth: Payer: Self-pay | Admitting: *Deleted

## 2020-01-11 NOTE — Telephone Encounter (Signed)
PA Rx Insulin Aspart solution coverage was approved for 03/19/2019 till 03/17/2021

## 2020-01-24 ENCOUNTER — Other Ambulatory Visit: Payer: Self-pay | Admitting: Family Medicine

## 2020-02-01 ENCOUNTER — Other Ambulatory Visit: Payer: Self-pay | Admitting: Family Medicine

## 2020-02-21 ENCOUNTER — Other Ambulatory Visit: Payer: Self-pay | Admitting: Family Medicine

## 2020-02-29 ENCOUNTER — Other Ambulatory Visit: Payer: Self-pay | Admitting: Family Medicine

## 2020-03-28 ENCOUNTER — Other Ambulatory Visit: Payer: Self-pay | Admitting: Family Medicine

## 2020-04-04 ENCOUNTER — Encounter: Payer: Self-pay | Admitting: Neurology

## 2020-04-05 ENCOUNTER — Ambulatory Visit: Payer: Medicare HMO | Admitting: Neurology

## 2020-04-11 ENCOUNTER — Other Ambulatory Visit: Payer: Self-pay

## 2020-04-11 ENCOUNTER — Encounter: Payer: Self-pay | Admitting: Family Medicine

## 2020-04-11 ENCOUNTER — Ambulatory Visit (INDEPENDENT_AMBULATORY_CARE_PROVIDER_SITE_OTHER): Payer: Medicare HMO | Admitting: Family Medicine

## 2020-04-11 VITALS — BP 145/79 | HR 57 | Temp 97.9°F | Ht 65.0 in | Wt 164.6 lb

## 2020-04-11 DIAGNOSIS — E1069 Type 1 diabetes mellitus with other specified complication: Secondary | ICD-10-CM | POA: Diagnosis not present

## 2020-04-11 DIAGNOSIS — E538 Deficiency of other specified B group vitamins: Secondary | ICD-10-CM | POA: Diagnosis not present

## 2020-04-11 DIAGNOSIS — E1065 Type 1 diabetes mellitus with hyperglycemia: Secondary | ICD-10-CM | POA: Diagnosis not present

## 2020-04-11 DIAGNOSIS — E039 Hypothyroidism, unspecified: Secondary | ICD-10-CM | POA: Diagnosis not present

## 2020-04-11 DIAGNOSIS — E1159 Type 2 diabetes mellitus with other circulatory complications: Secondary | ICD-10-CM

## 2020-04-11 DIAGNOSIS — E663 Overweight: Secondary | ICD-10-CM

## 2020-04-11 DIAGNOSIS — Z0001 Encounter for general adult medical examination with abnormal findings: Secondary | ICD-10-CM | POA: Diagnosis not present

## 2020-04-11 DIAGNOSIS — E785 Hyperlipidemia, unspecified: Secondary | ICD-10-CM

## 2020-04-11 DIAGNOSIS — Z6827 Body mass index (BMI) 27.0-27.9, adult: Secondary | ICD-10-CM

## 2020-04-11 DIAGNOSIS — G3184 Mild cognitive impairment, so stated: Secondary | ICD-10-CM

## 2020-04-11 DIAGNOSIS — I152 Hypertension secondary to endocrine disorders: Secondary | ICD-10-CM

## 2020-04-11 DIAGNOSIS — E559 Vitamin D deficiency, unspecified: Secondary | ICD-10-CM | POA: Diagnosis not present

## 2020-04-11 LAB — COMPREHENSIVE METABOLIC PANEL
ALT: 17 U/L (ref 0–53)
AST: 19 U/L (ref 0–37)
Albumin: 4 g/dL (ref 3.5–5.2)
Alkaline Phosphatase: 60 U/L (ref 39–117)
BUN: 17 mg/dL (ref 6–23)
CO2: 30 mEq/L (ref 19–32)
Calcium: 9.5 mg/dL (ref 8.4–10.5)
Chloride: 105 mEq/L (ref 96–112)
Creatinine, Ser: 1.17 mg/dL (ref 0.40–1.50)
GFR: 62.88 mL/min (ref >60.00)
Glucose, Bld: 241 mg/dL — ABNORMAL HIGH (ref 70–99)
Potassium: 4.5 mEq/L (ref 3.5–5.1)
Sodium: 138 mEq/L (ref 135–145)
Total Bilirubin: 0.6 mg/dL (ref 0.2–1.2)
Total Protein: 6.8 g/dL (ref 6.0–8.3)

## 2020-04-11 LAB — VITAMIN D 25 HYDROXY (VIT D DEFICIENCY, FRACTURES): VITD: 100.75 ng/mL — ABNORMAL HIGH (ref 30.00–100.00)

## 2020-04-11 LAB — CBC
HCT: 39.8 % (ref 39.0–52.0)
Hemoglobin: 13.6 g/dL (ref 13.0–17.0)
MCHC: 34.1 g/dL (ref 30.0–36.0)
MCV: 94.9 fl (ref 78.0–100.0)
Platelets: 271 10*3/uL (ref 150.0–400.0)
RBC: 4.2 Mil/uL — ABNORMAL LOW (ref 4.22–5.81)
RDW: 12.9 % (ref 11.5–15.5)
WBC: 6.6 10*3/uL (ref 4.0–10.5)

## 2020-04-11 LAB — LIPID PANEL
Cholesterol: 146 mg/dL (ref 0–200)
HDL: 42.4 mg/dL (ref >39.00)
LDL Cholesterol: 84 mg/dL (ref 0–99)
NonHDL: 103.55
Total CHOL/HDL Ratio: 3
Triglycerides: 100 mg/dL (ref 0.0–149.0)
VLDL: 20 mg/dL (ref 0.0–40.0)

## 2020-04-11 LAB — POCT GLYCOSYLATED HEMOGLOBIN (HGB A1C): Hemoglobin A1C: 6.7 % — AB (ref 4.0–5.6)

## 2020-04-11 LAB — VITAMIN B12: Vitamin B-12: 1160 pg/mL — ABNORMAL HIGH (ref 211–911)

## 2020-04-11 LAB — TSH: TSH: 1.65 u[IU]/mL (ref 0.35–4.50)

## 2020-04-11 NOTE — Assessment & Plan Note (Signed)
Check B12 

## 2020-04-11 NOTE — Patient Instructions (Signed)
It was very nice to see you today!  We will check blood work today.  No changes today.  Your A1c is 6.7.  I will see you back in 1 year for your next annual physical with labs.  Please come back to see me sooner if needed.  Take care, Dr Jerline Pain  Please try these tips to maintain a healthy lifestyle:   Eat at least 3 REAL meals and 1-2 snacks per day.  Aim for no more than 5 hours between eating.  If you eat breakfast, please do so within one hour of getting up.    Each meal should contain half fruits/vegetables, one quarter protein, and one quarter carbs (no bigger than a computer mouse)   Cut down on sweet beverages. This includes juice, soda, and sweet tea.     Drink at least 1 glass of water with each meal and aim for at least 8 glasses per day   Exercise at least 150 minutes every week.    Preventive Care 72 Years and Older, Male Preventive care refers to lifestyle choices and visits with your health care provider that can promote health and wellness. This includes:  A yearly physical exam. This is also called an annual wellness visit.  Regular dental and eye exams.  Immunizations.  Screening for certain conditions.  Healthy lifestyle choices, such as: ? Eating a healthy diet. ? Getting regular exercise. ? Not using drugs or products that contain nicotine and tobacco. ? Limiting alcohol use. What can I expect for my preventive care visit? Physical exam Your health care provider will check your:  Height and weight. These may be used to calculate your BMI (body mass index). BMI is a measurement that tells if you are at a healthy weight.  Heart rate and blood pressure.  Body temperature.  Skin for abnormal spots. Counseling Your health care provider may ask you questions about your:  Past medical problems.  Family's medical history.  Alcohol, tobacco, and drug use.  Emotional well-being.  Home life and relationship well-being.  Sexual  activity.  Diet, exercise, and sleep habits.  History of falls.  Memory and ability to understand (cognition).  Work and work Statistician.  Access to firearms. What immunizations do I need? Vaccines are usually given at various ages, according to a schedule. Your health care provider will recommend vaccines for you based on your age, medical history, and lifestyle or other factors, such as travel or where you work.   What tests do I need? Blood tests  Lipid and cholesterol levels. These may be checked every 5 years, or more often depending on your overall health.  Hepatitis C test.  Hepatitis B test. Screening  Lung cancer screening. You may have this screening every year starting at age 72 if you have a 30-pack-year history of smoking and currently smoke or have quit within the past 15 years.  Colorectal cancer screening. ? All adults should have this screening starting at age 72 and continuing until age 72. ? Your health care provider may recommend screening at age 72 if you are at increased risk. ? You will have tests every 1-10 years, depending on your results and the type of screening test.  Prostate cancer screening. Recommendations will vary depending on your family history and other risks.  Genital exam to check for testicular cancer or hernias.  Diabetes screening. ? This is done by checking your blood sugar (glucose) after you have not eaten for a while (fasting). ? You  may have this done every 1-3 years.  Abdominal aortic aneurysm (AAA) screening. You may need this if you are a current or former smoker.  STD (sexually transmitted disease) testing, if you are at risk. Follow these instructions at home: Eating and drinking  Eat a diet that includes fresh fruits and vegetables, whole grains, lean protein, and low-fat dairy products. Limit your intake of foods with high amounts of sugar, saturated fats, and salt.  Take vitamin and mineral supplements as  recommended by your health care provider.  Do not drink alcohol if your health care provider tells you not to drink.  If you drink alcohol: ? Limit how much you have to 0-2 drinks a day. ? Be aware of how much alcohol is in your drink. In the [place] one drink equals one 12 oz bottle of beer (355 mL), one 5 oz glass of wine (148 mL), or one 1 oz glass of hard liquor (44 mL).   Lifestyle  Take daily care of your teeth and gums. Brush your teeth every morning and night with fluoride toothpaste. Floss one time each day.  Stay active. Exercise for at least 30 minutes 5 or more days each week.  Do not use any products that contain nicotine or tobacco, such as cigarettes, e-cigarettes, and chewing tobacco. If you need help quitting, ask your health care provider.  Do not use drugs.  If you are sexually active, practice safe sex. Use a condom or other form of protection to prevent STIs (sexually transmitted infections).  Talk with your health care provider about taking a low-dose aspirin or statin.  Find healthy ways to cope with stress, such as: ? Meditation, yoga, or listening to music. ? Journaling. ? Talking to a trusted person. ? Spending time with friends and family. Safety  Always wear your seat belt while driving or riding in a vehicle.  Do not drive: ? If you have been drinking alcohol. Do not ride with someone who has been drinking. ? When you are tired or distracted. ? While texting.  Wear a helmet and other protective equipment during sports activities.  If you have firearms in your house, make sure you follow all gun safety procedures. What's next?  Visit your health care provider once a year for an annual wellness visit.  Ask your health care provider how often you should have your eyes and teeth checked.  Stay up to date on all vaccines. This information is not intended to replace advice given to you by your health care provider. Make sure you discuss any questions  you have with your health care provider. Document Revised: 12/01/2018 Document Reviewed: 02/26/2018 Elsevier Patient Education  2021 [employer].

## 2020-04-11 NOTE — Assessment & Plan Note (Signed)
Will be following up with neurology soon.

## 2020-04-11 NOTE — Assessment & Plan Note (Signed)
Continue lipitor 20mg  daily. Check lipids today.

## 2020-04-11 NOTE — Assessment & Plan Note (Signed)
A1c 6.7 today. Continue insulin per endocrinology.

## 2020-04-11 NOTE — Assessment & Plan Note (Signed)
Check vitamin D. 

## 2020-04-11 NOTE — Assessment & Plan Note (Addendum)
Check TSH. Continue synthroid 36mcg daily.

## 2020-04-11 NOTE — Progress Notes (Signed)
Chief Complaint:  Rodney Miller is a 70 y.o. male who presents today for his annual comprehensive physical exam.    Assessment/Plan:  Chronic Problems Addressed Today: B12 deficiency Check B12.   Type 1 diabetes mellitus with hyperglycemia (HCC) A1c 6.7 today. Continue insulin per endocrinology.   Hypothyroidism Check TSH. Continue synthroid 7mcg daily.   Hypertension associated with diabetes ([place]) At goal per [facility]. Continue lisinopril 10mg  daily. Check labs.   Dyslipidemia due to type 1 diabetes mellitus (HCC) Continue lipitor 20mg  daily. Check lipids today.   Mild cognitive impairment Will be following up with neurology soon.   Vitamin D deficiency Check vitamin D.    Body mass index is 27.39 kg/m. / Overweight  BMI Metric Follow Up - 04/11/20 1029      BMI Metric Follow Up-Please document annually   BMI Metric Follow Up Education provided            Preventative Healthcare: He will get eye exam in a few months. Check labs today. Up-to-date on vaccines.  Patient Counseling(The following topics were reviewed and/or handout was given):  -Nutrition: Stressed importance of moderation in sodium/caffeine intake, saturated fat and cholesterol, caloric balance, sufficient intake of fresh fruits, vegetables, and fiber.  -Stressed the importance of regular exercise.   -Substance Abuse: Discussed cessation/primary prevention of tobacco, alcohol, or other drug use; driving or other dangerous activities under the influence; availability of treatment for abuse.   -Injury prevention: Discussed safety belts, safety helmets, smoke detector, smoking near bedding or upholstery.   -Sexuality: Discussed sexually transmitted diseases, partner selection, use of condoms, avoidance of unintended pregnancy and contraceptive alternatives.   -Dental health: Discussed importance of regular tooth brushing, flossing, and dental visits.  -Health maintenance and immunizations reviewed. Please refer  to Health maintenance section.  Return to care in 1 year for next preventative visit.     Subjective:  HPI:  He has no acute complaints today.   Lifestyle Diet: Balanced. Tried to get plenty of fruits and vegetables.  Exercise: Likes to go on walks.   Depression screen [facility] 2/9 04/11/2020  Decreased Interest 0  Down, Depressed, Hopeless 0  PHQ - 2 Score 0  Altered sleeping -  Tired, decreased energy -  Change in appetite -  Feeling bad or failure about yourself  -  Trouble concentrating -  Moving slowly or fidgety/restless -  Suicidal thoughts -  PHQ-9 Score -   Health Maintenance Due  Topic Date Due  . TETANUS/TDAP  Never done  . OPHTHALMOLOGY EXAM  05/27/2019  . FOOT EXAM  10/01/2019  . COVID-19 Vaccine (3 - Booster for Moderna series) 03/02/2020    ROS: Per HPI, otherwise a complete review of systems was negative.   PMH:  The following were reviewed and entered/updated in epic: Past Medical History:  Diagnosis Date  . Diabetes mellitus without complication ([place])   . Hypertension   . Kidney stones    Patient Active Problem List   Diagnosis Date Noted  . Vitamin D deficiency 04/11/2020  . B12 deficiency 12/27/2019  . Mass of right lower leg 04/09/2018  . Dyslipidemia due to type 1 diabetes mellitus ([place]) 04/09/2018  . Hypertension associated with diabetes ([place]) 04/09/2018  . Mild cognitive impairment 04/09/2018  . Type 1 diabetes mellitus with hyperglycemia ([place]) 01/30/2018  . Hypothyroidism 04/01/2017   History reviewed. No pertinent surgical history.  Family History  Problem Relation Age of Onset  . Juvenile Diabetes Son   . Juvenile Diabetes Grandchild   .  Prostate cancer Neg Hx   . [place] cancer Neg Hx     Medications- reviewed and updated Current Outpatient Medications  Medication Sig Dispense Refill  . ascorbic acid (VITAMIN C) 500 MG tablet Take 500 mg by mouth daily.    Marland Kitchen aspirin EC 81 MG tablet Take 81 mg by mouth daily.    Marland Kitchen atorvastatin  (LIPITOR) 20 MG tablet TAKE ONE TABLET BY MOUTH DAILY 90 tablet 0  . Cholecalciferol (D3 HIGH POTENCY) 125 MCG (5000 UT) capsule Take 5,000 Units by mouth daily.    . Continuous Blood Gluc Sensor (DEXCOM G6 SENSOR) MISC 1 Device by Does not apply route as directed. 12 each 3  . Continuous Blood Gluc Transmit (DEXCOM G6 TRANSMITTER) MISC 1 Device by Does not apply route as directed. 1 each 3  . desonide (DESOWEN) 0.05 % cream Apply topically 2 (two) times daily. 30 g 0  . insulin aspart (NOVOLOG) 100 UNIT/ML injection USE UP TO 75 UNITS DAILY VIA INSULIN PUMP AS DIRECTED 10 mL 0  . Insulin Infusion Pump (T:SLIM INSULIN PUMP) DEVI by Does not apply route.    Marland Kitchen levothyroxine (SYNTHROID) 75 MCG tablet TAKE ONE TABLET BY MOUTH DAILY BEFORE BREAKFAST 90 tablet 0  . lisinopril (ZESTRIL) 10 MG tablet TAKE ONE TABLET BY MOUTH DAILY 90 tablet 0  . meloxicam (MOBIC) 7.5 MG tablet TAKE ONE TABLET BY MOUTH DAILY 30 tablet 0   No current facility-administered medications for this visit.    Allergies-reviewed and updated No Known Allergies  Social History   Socioeconomic History  . Marital status: Married    Spouse name: Not on file  . Number of children: Not on file  . Years of education: Not on file  . Highest education level: Not on file  Occupational History  . Not on file  Tobacco Use  . Smoking status: Never Smoker  . Smokeless tobacco: Never Used  Vaping Use  . Vaping Use: Never used  Substance and Sexual Activity  . Alcohol use: Not Currently  . Drug use: Never  . Sexual activity: Not on file  Other Topics Concern  . Not on file  Social History Narrative  . Not on file   Social Determinants of Health   Financial Resource Strain: Not on file  Food Insecurity: Not on file  Transportation Needs: Not on file  Physical Activity: Not on file  Stress: Not on file  Social Connections: Not on file        Objective:  Physical Exam: BP (!) 145/79   Pulse (!) 57   Temp 97.9 F  (36.6 C) (Temporal)   Ht 5\' 5"  (1.651 m)   Wt 164 lb 9.6 oz (74.7 kg)   SpO2 100%   BMI 27.39 kg/m   Body mass index is 27.39 kg/m. Wt Readings from Last 3 Encounters:  04/11/20 164 lb 9.6 oz (74.7 kg)  12/27/19 165 lb 12.8 oz (75.2 kg)  07/27/19 166 lb (75.3 kg)   Gen: NAD, resting comfortably HEENT: TMs normal bilaterally. OP clear. No thyromegaly noted.  CV: RRR with no murmurs appreciated Pulm: NWOB, CTAB with no crackles, wheezes, or rhonchi GI: Normal bowel sounds present. Soft, Nontender, Nondistended. MSK: no edema, cyanosis, or clubbing noted Skin: warm, dry Neuro: CN2-12 grossly intact. Strength 5/5 in upper and lower extremities. Reflexes symmetric and intact bilaterally.  Psych: Normal affect and thought content     Caleb M. Jerline Pain, MD 04/11/2020 10:31 AM

## 2020-04-11 NOTE — Assessment & Plan Note (Signed)
At goal per [facility]. Continue lisinopril 10mg  daily. Check labs.

## 2020-04-13 NOTE — Progress Notes (Signed)
Please inform patient of the following:  Labs are alll stable. Do not need to make any changes to his treatment plan at this time. We can recheck in a year.  Rodney Miller. Jerline Pain, MD 04/13/2020 12:51 PM

## 2020-04-19 DIAGNOSIS — E1065 Type 1 diabetes mellitus with hyperglycemia: Secondary | ICD-10-CM | POA: Diagnosis not present

## 2020-05-01 ENCOUNTER — Other Ambulatory Visit: Payer: Self-pay | Admitting: *Deleted

## 2020-05-01 MED ORDER — MELOXICAM 7.5 MG PO TABS: 7.5000 mg | ORAL_TABLET | Freq: Every day | ORAL | 0 refills | Status: DC

## 2020-05-04 ENCOUNTER — Other Ambulatory Visit: Payer: Self-pay | Admitting: Family Medicine

## 2020-05-11 DIAGNOSIS — E1065 Type 1 diabetes mellitus with hyperglycemia: Secondary | ICD-10-CM | POA: Diagnosis not present

## 2020-05-19 DIAGNOSIS — E1065 Type 1 diabetes mellitus with hyperglycemia: Secondary | ICD-10-CM | POA: Diagnosis not present

## 2020-05-29 ENCOUNTER — Other Ambulatory Visit: Payer: Self-pay | Admitting: Family Medicine

## 2020-06-01 ENCOUNTER — Encounter: Payer: Self-pay | Admitting: Neurology

## 2020-06-01 ENCOUNTER — Other Ambulatory Visit: Payer: Self-pay

## 2020-06-01 ENCOUNTER — Ambulatory Visit: Payer: Medicare HMO | Admitting: Neurology

## 2020-06-01 VITALS — BP 162/83 | HR 71 | Resp 20 | Ht 65.0 in | Wt 169.0 lb

## 2020-06-01 DIAGNOSIS — G3184 Mild cognitive impairment, so stated: Secondary | ICD-10-CM

## 2020-06-01 NOTE — Patient Instructions (Addendum)
1. Schedule MRI brain without contrast  2. Schedule Neurocognitive testing  3. Follow-up in 6 months, call for any changes  You have been referred for a neuropsychological evaluation (i.e., evaluation of memory and thinking abilities). Please bring someone with you to this appointment if possible, as it is helpful for the doctor to hear from both you and another adult who knows you well. Please bring eyeglasses and hearing aids if you wear them.    The evaluation will take approximately 3 hours and has two parts:   . The first part is a clinical interview with the neuropsychologist (Dr. Melvyn Novas or Dr. Nicole Kindred). During the interview, the neuropsychologist will speak with you and the individual you brought to the appointment.    . The second part of the evaluation is testing with the doctor's technician Hinton Dyer or Maudie Mercury). During the testing, the technician will ask you to remember different types of material, solve problems, and answer some questionnaires. Your family member will not be present for this portion of the evaluation.   Please note: We must reserve several hours of the neuropsychologist's time and the psychometrician's time for your evaluation appointment. As such, there is a No-Show fee of $100. If you are unable to attend any of your appointments, please contact our office as soon as possible to reschedule.    RECOMMENDATIONS FOR ALL PATIENTS WITH MEMORY PROBLEMS: 1. Continue to exercise (Recommend 30 minutes of walking everyday, or 3 hours every week) 2. Increase social interactions - continue going to [place] and enjoy social gatherings with friends and family 3. Eat healthy, avoid fried foods and eat more fruits and vegetables 4. Maintain adequate blood pressure, blood sugar, and blood cholesterol level. Reducing the risk of stroke and cardiovascular disease also helps promoting better memory. 5. Avoid stressful situations. Live a simple life and avoid aggravations. Organize your time  and prepare for the next day in anticipation. 6. Sleep well, avoid any interruptions of sleep and avoid any distractions in the bedroom that may interfere with adequate sleep quality 7. Avoid sugar, avoid sweets as there is a strong link between excessive sugar intake, diabetes, and cognitive impairment We discussed the Mediterranean diet, which has been shown to help patients reduce the risk of progressive memory disorders and reduces cardiovascular risk. This includes eating fish, eat fruits and green leafy vegetables, nuts like almonds and hazelnuts, walnuts, and also use olive oil. Avoid fast foods and fried foods as much as possible. Avoid sweets and sugar as sugar use has been linked to worsening of memory function.   We have sent a referral to [facility] for your MRI and they will call you directly to schedule your appointment. They are located at [address]. If you need to contact them directly please call [phone].

## 2020-06-01 NOTE — Progress Notes (Signed)
NEUROLOGY CONSULTATION NOTE  Rodney Miller MRN: [id number] DOB: [birth year]  Referring provider: Dr. Dimas Chyle Primary care provider: Dr. Dimas Chyle  Reason for consult:  Memory loss  Dear Dr Jerline Pain:  Thank you for your kind referral of Rodney Miller for consultation of the above symptoms. Although his history is well known to you, please allow me to reiterate it for the purpose of our medical record. The patient was accompanied to the clinic by his wife who also provides collateral information. Records and images were personally reviewed where available.   HISTORY OF PRESENT ILLNESS: This is a 70 year old right-handed man with a history of type 1 DM, hypertension, hypothyroidism, hyperlipidemia, presenting for evaluation of Mild Cognitive Impairment. He feels there is something wrong with his memory, there are things he forgets, then it comes back in a couple of days. His wife noticed changes around 4 years ago. When they moved here, there was a big change, she noticed a culture shock moving from [facility] to [place] to be closer to family. He misplaces things, always looking for his glasses or cellphone. If he does not write down a date, he would forget the appointment. He gest distracted and starts doing something else. He denies getting lost driving, relying on his GPS. He may forget to take his medication on time, but takes them daily. He denies any missed bills, majority are on autopay and they do it together. He denies leaving the stove on. No family history of dementia, significant head injuries, or alcohol use. He took Donepezil for 1-2 weeks but after he read that it can cause dementia, he stopped medication (no side effects). His wife notes he felt lonely with their move, the different lifestyle here bothers him. He dwells on things, and cannot remember names of people at church. He feels mood is okay. No paranoia, hallucinations, REM behavior disorder.   He has occasional dizziness. He has joint  pains, his wife notes he struggles to do things due to this. He denies any headaches, diplopia, dysarthria/dysphagia, bowel/bladder dysfunction, anosmia, or tremors. He usually gets 4 hours of interrupted sleep, feeling tired during the day. He barely snores. He has too many dreams, he tries to remember what he has to do, keeping him awake.    Laboratory Data: Lab Results  Component Value Date   TSH 1.65 04/11/2020   Lab Results  Component Value Date   [id number] 2,778 (H) 04/11/2020   Lab Results  Component Value Date   HGBA1C 6.7 (A) 04/11/2020     PAST MEDICAL HISTORY: Past Medical History:  Diagnosis Date  . Diabetes mellitus without complication ([place])   . Hypertension   . Kidney stones     PAST SURGICAL HISTORY: History reviewed. No pertinent surgical history.  MEDICATIONS: Current Outpatient Medications on File Prior to Visit  Medication Sig Dispense Refill  . ascorbic acid (VITAMIN C) 500 MG tablet Take 500 mg by mouth daily.    Marland Kitchen aspirin EC 81 MG tablet Take 81 mg by mouth daily.    Marland Kitchen atorvastatin (LIPITOR) 20 MG tablet TAKE ONE TABLET BY MOUTH DAILY 90 tablet 0  . Cholecalciferol (D3 HIGH POTENCY) 125 MCG (5000 UT) capsule Take 5,000 Units by mouth daily.    . Continuous Blood Gluc Sensor (DEXCOM G6 SENSOR) MISC 1 Device by Does not apply route as directed. 12 each 3  . Continuous Blood Gluc Transmit (DEXCOM G6 TRANSMITTER) MISC 1 Device by Does not apply route as directed. 1 each  3  . desonide (DESOWEN) 0.05 % cream Apply topically 2 (two) times daily. 30 g 0  . insulin aspart (NOVOLOG) 100 UNIT/ML injection USE UP TO 75 UNITS DAILY VIA INSULIN PUMP AS DIRECTED 10 mL 0  . Insulin Infusion Pump (T:SLIM INSULIN PUMP) DEVI by Does not apply route.    Marland Kitchen levothyroxine (SYNTHROID) 75 MCG tablet TAKE ONE TABLET BY MOUTH DAILY BEFORE BREAKFAST 90 tablet 0  . lisinopril (ZESTRIL) 10 MG tablet TAKE ONE TABLET BY MOUTH DAILY 90 tablet 0  . meloxicam (MOBIC) 7.5 MG tablet  TAKE ONE TABLET BY MOUTH DAILY 30 tablet 0   No current facility-administered medications on file prior to visit.    ALLERGIES: No Known Allergies  FAMILY HISTORY: Family History  Problem Relation Age of Onset  . Juvenile Diabetes Son   . Juvenile Diabetes Grandchild   . Prostate cancer Neg Hx   . Mcclaine cancer Neg Hx     SOCIAL HISTORY: Social History   Socioeconomic History  . Marital status: Married    Spouse name: Not on file  . Number of children: Not on file  . Years of education: 15  . Highest education level: Not on file  Occupational History  . Occupation: retired  Tobacco Use  . Smoking status: Never Smoker  . Smokeless tobacco: Never Used  Vaping Use  . Vaping Use: Never used  Substance and Sexual Activity  . Alcohol use: Not Currently  . Drug use: Never  . Sexual activity: Not on file  Other Topics Concern  . Not on file  Social History Narrative   Right handed   One story home   Drinks caffeine   Social Determinants of Health   Financial Resource Strain: Not on file  Food Insecurity: Not on file  Transportation Needs: Not on file  Physical Activity: Not on file  Stress: Not on file  Social Connections: Not on file  Intimate Partner Violence: Not on file     PHYSICAL EXAM: Vitals:   06/01/20 0900  BP: (!) 162/83  Pulse: 71  Resp: 20  SpO2: 98%   General: No acute distress Head:  Normocephalic/atraumatic Skin/Extremities: No rash, no edema Neurological Exam: Mental status: alert and oriented to person, place, and time, no dysarthria or aphasia, Fund of knowledge is appropriate.  Recent and remote memory are impaired.  Attention and concentration are normal.  Able to name objects and repeat phrases. [place] score 19/30 Montreal Cognitive Assessment  06/01/2020  Visuospatial/ Executive (0/5) 1  Naming (0/3) 3  Attention: Read list of digits (0/2) 2  Attention: Read list of letters (0/1) 1  Attention: Serial 7 subtraction starting at 100  (0/3) 3  Language: Repeat phrase (0/2) 1  Language : Fluency (0/1) 1  Abstraction (0/2) 1  Delayed Recall (0/5) 1  Orientation (0/6) 5  Total 19    Cranial nerves: CN I: not tested CN II: pupils equal, round and reactive to light, visual fields intact CN III, IV, VI:  full range of motion, no nystagmus, no ptosis CN V: facial sensation intact CN VII: upper and lower face symmetric CN VIII: hearing intact to conversation CN XI: sternocleidomastoid and trapezius muscles intact CN XII: tongue midline Bulk & Tone: normal, no fasciculations. Motor: 5/5 throughout with no pronator drift. Sensation: intact to light touch, cold, pin, vibration sense.  No extinction to double simultaneous stimulation.  Romberg test negative Deep Tendon Reflexes: +2 throughou Cerebellar: no incoordination on finger to nose testing Gait: slow  and cautious due to right leg pain Tremor: none   IMPRESSION: This is a 70 year old right-handed man with a history of type 1 DM, hypertension, hypothyroidism, hyperlipidemia, presenting for evaluation of Mild Cognitive Impairment. His neurological exam is non-focal, MOCA score today 19/30. He and his wife deny any significant difficulties with complex tasks, indicating Mild Cognitive Impairment. We discussed different causes of memory loss,MRI brain without contrast will be ordered to assess for underlying structural abnormality. He will be scheduled for Neurocognitive testing to further evaluate cognitive concerns, there may be a component of mood as well. We discussed the importance of control of vascular risk factors, physical exercise, and brain stimulation exercises for brain health. Follow-up in 6 months, call for any changes.   Thank you for allowing me to participate in the care of this patient. Please do not hesitate to call for any questions or concerns.   Ellouise Newer, M.D.  CC: Dr. Jerline Pain

## 2020-06-14 ENCOUNTER — Other Ambulatory Visit: Payer: Medicare HMO

## 2020-06-16 ENCOUNTER — Other Ambulatory Visit: Payer: Self-pay | Admitting: Family Medicine

## 2020-06-16 MED ORDER — ATORVASTATIN CALCIUM 20 MG PO TABS: 20.0000 mg | ORAL_TABLET | Freq: Every day | ORAL | 0 refills | Status: DC

## 2020-06-18 DIAGNOSIS — E1065 Type 1 diabetes mellitus with hyperglycemia: Secondary | ICD-10-CM | POA: Diagnosis not present

## 2020-06-24 ENCOUNTER — Other Ambulatory Visit: Payer: Self-pay | Admitting: Family Medicine

## 2020-06-25 ENCOUNTER — Ambulatory Visit
Admission: RE | Admit: 2020-06-25 | Discharge: 2020-06-25 | Disposition: A | Discharge status: Home / Self Care | Payer: Medicare HMO | Source: Ambulatory Visit | Attending: Neurology | Admitting: Neurology

## 2020-06-25 DIAGNOSIS — G319 Degenerative disease of nervous system, unspecified: Secondary | ICD-10-CM | POA: Diagnosis not present

## 2020-06-25 DIAGNOSIS — R413 Other amnesia: Secondary | ICD-10-CM | POA: Diagnosis not present

## 2020-06-27 ENCOUNTER — Other Ambulatory Visit: Payer: Medicare HMO

## 2020-06-27 ENCOUNTER — Telehealth: Payer: Self-pay

## 2020-06-27 NOTE — Telephone Encounter (Signed)
Pt called number not working

## 2020-06-27 NOTE — Telephone Encounter (Signed)
-----   Message from Cameron Sprang, MD sent at 06/27/2020 12:10 PM EDT ----- Pls let him know brain MRI did not show any evidence of tumor, stroke, or bleed. It showed age-related changes. Proceed with memory testing as scheduled, thanks

## 2020-07-18 DIAGNOSIS — E1065 Type 1 diabetes mellitus with hyperglycemia: Secondary | ICD-10-CM | POA: Diagnosis not present

## 2020-07-25 ENCOUNTER — Other Ambulatory Visit: Payer: Self-pay | Admitting: Family Medicine

## 2020-08-02 ENCOUNTER — Other Ambulatory Visit: Payer: Self-pay | Admitting: Family Medicine

## 2020-08-04 DIAGNOSIS — E1065 Type 1 diabetes mellitus with hyperglycemia: Secondary | ICD-10-CM | POA: Diagnosis not present

## 2020-08-07 DIAGNOSIS — Z9641 Presence of insulin pump (external) (internal): Secondary | ICD-10-CM | POA: Diagnosis not present

## 2020-08-07 DIAGNOSIS — Z794 Long term (current) use of insulin: Secondary | ICD-10-CM | POA: Diagnosis not present

## 2020-08-07 DIAGNOSIS — E1040 Type 1 diabetes mellitus with diabetic neuropathy, unspecified: Secondary | ICD-10-CM | POA: Diagnosis not present

## 2020-08-16 DIAGNOSIS — H2513 Age-related nuclear cataract, bilateral: Secondary | ICD-10-CM | POA: Diagnosis not present

## 2020-08-16 DIAGNOSIS — H43813 Vitreous degeneration, bilateral: Secondary | ICD-10-CM | POA: Diagnosis not present

## 2020-08-16 DIAGNOSIS — Z794 Long term (current) use of insulin: Secondary | ICD-10-CM | POA: Diagnosis not present

## 2020-08-16 DIAGNOSIS — H02831 Dermatochalasis of right upper eyelid: Secondary | ICD-10-CM | POA: Diagnosis not present

## 2020-08-16 DIAGNOSIS — H04123 Dry eye syndrome of bilateral lacrimal glands: Secondary | ICD-10-CM | POA: Diagnosis not present

## 2020-08-16 DIAGNOSIS — E119 Type 2 diabetes mellitus without complications: Secondary | ICD-10-CM | POA: Diagnosis not present

## 2020-08-16 DIAGNOSIS — H02834 Dermatochalasis of left upper eyelid: Secondary | ICD-10-CM | POA: Diagnosis not present

## 2020-08-17 DIAGNOSIS — E1065 Type 1 diabetes mellitus with hyperglycemia: Secondary | ICD-10-CM | POA: Diagnosis not present

## 2020-08-21 ENCOUNTER — Encounter: Payer: Medicare HMO | Admitting: Psychology

## 2020-08-22 ENCOUNTER — Other Ambulatory Visit: Payer: Self-pay | Admitting: *Deleted

## 2020-08-22 MED ORDER — MELOXICAM 7.5 MG PO TABS: 7.5000 mg | ORAL_TABLET | Freq: Every day | ORAL | 1 refills | Status: DC

## 2020-08-28 ENCOUNTER — Encounter: Payer: Medicare HMO | Admitting: Psychology

## 2020-09-04 ENCOUNTER — Ambulatory Visit: Payer: Medicare HMO | Admitting: Neurology

## 2020-09-16 DIAGNOSIS — E1065 Type 1 diabetes mellitus with hyperglycemia: Secondary | ICD-10-CM | POA: Diagnosis not present

## 2020-09-26 ENCOUNTER — Other Ambulatory Visit: Payer: Self-pay | Admitting: *Deleted

## 2020-09-26 MED ORDER — ATORVASTATIN CALCIUM 20 MG PO TABS: 20.0000 mg | ORAL_TABLET | Freq: Every day | ORAL | 2 refills | Status: AC

## 2020-10-16 DIAGNOSIS — E1065 Type 1 diabetes mellitus with hyperglycemia: Secondary | ICD-10-CM | POA: Diagnosis not present

## 2020-11-10 ENCOUNTER — Other Ambulatory Visit: Payer: Self-pay | Admitting: Family Medicine

## 2020-11-15 DIAGNOSIS — E1065 Type 1 diabetes mellitus with hyperglycemia: Secondary | ICD-10-CM | POA: Diagnosis not present

## 2020-11-16 DIAGNOSIS — E1065 Type 1 diabetes mellitus with hyperglycemia: Secondary | ICD-10-CM | POA: Diagnosis not present

## 2020-12-13 DIAGNOSIS — M1990 Unspecified osteoarthritis, unspecified site: Secondary | ICD-10-CM | POA: Diagnosis not present

## 2020-12-13 DIAGNOSIS — E109 Type 1 diabetes mellitus without complications: Secondary | ICD-10-CM | POA: Diagnosis not present

## 2020-12-13 DIAGNOSIS — I10 Essential (primary) hypertension: Secondary | ICD-10-CM | POA: Diagnosis not present

## 2020-12-13 DIAGNOSIS — Z0000 Encounter for general adult medical examination without abnormal findings: Secondary | ICD-10-CM | POA: Diagnosis not present

## 2020-12-13 DIAGNOSIS — E039 Hypothyroidism, unspecified: Secondary | ICD-10-CM | POA: Diagnosis not present

## 2020-12-13 DIAGNOSIS — R809 Proteinuria, unspecified: Secondary | ICD-10-CM | POA: Diagnosis not present

## 2020-12-13 DIAGNOSIS — E785 Hyperlipidemia, unspecified: Secondary | ICD-10-CM | POA: Diagnosis not present

## 2020-12-13 DIAGNOSIS — Z8639 Personal history of other endocrine, nutritional and metabolic disease: Secondary | ICD-10-CM | POA: Diagnosis not present

## 2020-12-13 DIAGNOSIS — R5383 Other fatigue: Secondary | ICD-10-CM | POA: Diagnosis not present

## 2020-12-13 DIAGNOSIS — Z0189 Encounter for other specified special examinations: Secondary | ICD-10-CM | POA: Diagnosis not present

## 2020-12-13 DIAGNOSIS — Z1211 Encounter for screening for malignant neoplasm of colon: Secondary | ICD-10-CM | POA: Diagnosis not present

## 2020-12-13 DIAGNOSIS — M25562 Pain in left knee: Secondary | ICD-10-CM | POA: Diagnosis not present

## 2020-12-13 DIAGNOSIS — M549 Dorsalgia, unspecified: Secondary | ICD-10-CM | POA: Diagnosis not present

## 2020-12-13 DIAGNOSIS — E1029 Type 1 diabetes mellitus with other diabetic kidney complication: Secondary | ICD-10-CM | POA: Diagnosis not present

## 2020-12-13 DIAGNOSIS — Z87442 Personal history of urinary calculi: Secondary | ICD-10-CM | POA: Diagnosis not present

## 2020-12-13 DIAGNOSIS — E782 Mixed hyperlipidemia: Secondary | ICD-10-CM | POA: Diagnosis not present

## 2020-12-13 DIAGNOSIS — G3184 Mild cognitive impairment, so stated: Secondary | ICD-10-CM | POA: Diagnosis not present

## 2020-12-13 DIAGNOSIS — N4289 Other specified disorders of prostate: Secondary | ICD-10-CM | POA: Diagnosis not present

## 2020-12-15 DIAGNOSIS — E1065 Type 1 diabetes mellitus with hyperglycemia: Secondary | ICD-10-CM | POA: Diagnosis not present

## 2020-12-21 ENCOUNTER — Telehealth: Payer: Self-pay | Admitting: Family Medicine

## 2020-12-21 NOTE — Chronic Care Management (AMB) (Signed)
  Care Management  Note   12/21/2020 Name: Rodney Miller MRN: [id number] DOB: 10-02-1950  Rodney Miller is a 69 y.o. year old male who is a primary care patient of Vivi Barrack, MD. The care management team was consulted for assistance with chronic disease management and care coordination needs.   Rodney Miller was given information about Care Management services today including:  CCM service includes personalized support from designated clinical staff supervised by the physician, including individualized plan of care and coordination with other care providers 24/7 contact phone numbers for assistance for urgent and routine care needs. Service will only be billed when office clinical staff spend 20 minutes or more in a month to coordinate care. Only one practitioner may furnish and bill the service in a calendar month. The patient may stop CCM services at amy time (effective at the end of the month) by phone call to the office staff. The patient will be responsible for cost sharing (co-pay) or up to 20% of the service fee (after annual deductible is met)  Patient agreed to services and verbal consent obtained.  Follow up plan:   Face to Face appointment with care management team member scheduled for: 02/06/21 _0   Noelle Penner Upstream Scheduler

## 2020-12-30 DIAGNOSIS — I10 Essential (primary) hypertension: Secondary | ICD-10-CM | POA: Diagnosis not present

## 2021-01-02 ENCOUNTER — Other Ambulatory Visit: Payer: Self-pay

## 2021-01-02 ENCOUNTER — Ambulatory Visit
Admission: RE | Admit: 2021-01-02 | Discharge: 2021-01-02 | Disposition: A | Discharge status: Home / Self Care | Payer: Medicare HMO | Source: Ambulatory Visit | Attending: Family Medicine | Admitting: Family Medicine

## 2021-01-02 ENCOUNTER — Other Ambulatory Visit: Payer: Self-pay | Admitting: Family Medicine

## 2021-01-02 DIAGNOSIS — M549 Dorsalgia, unspecified: Secondary | ICD-10-CM

## 2021-01-02 DIAGNOSIS — R109 Unspecified abdominal pain: Secondary | ICD-10-CM | POA: Diagnosis not present

## 2021-01-14 DIAGNOSIS — E1065 Type 1 diabetes mellitus with hyperglycemia: Secondary | ICD-10-CM | POA: Diagnosis not present

## 2021-01-24 ENCOUNTER — Telehealth: Payer: Self-pay | Admitting: Family Medicine

## 2021-01-24 NOTE — Chronic Care Management (AMB) (Signed)
  Care Management  Note   01/24/2021 Name: Rodney Miller MRN: [id number] DOB: 03-14-1951  [name] is a 70 y.o. year old male who is a primary care patient of Vivi Barrack, MD. The care management team was consulted for assistance with chronic disease management and care coordination needs.   Mr. Odonell was given information about Care Management services today including:  CCM service includes personalized support from designated clinical staff supervised by the physician, including individualized plan of care and coordination with other care providers 24/7 contact phone numbers for assistance for urgent and routine care needs. Service will only be billed when office clinical staff spend 20 minutes or more in a month to coordinate care. Only one practitioner may furnish and bill the service in a calendar month. The patient may stop CCM services at amy time (effective at the end of the month) by phone call to the office staff. The patient will be responsible for cost sharing (co-pay) or up to 20% of the service fee (after annual deductible is met)  Patient agreed to services and verbal consent obtained.  Follow up plan:   Face to Face appointment with care management team member scheduled for: 02/06/21 _0   Noelle Penner Upstream Scheduler

## 2021-01-31 ENCOUNTER — Telehealth: Payer: Self-pay | Admitting: Pharmacist

## 2021-01-31 NOTE — Chronic Care Management (AMB) (Signed)
Chronic Care Management Pharmacy Assistant   Name: Rodney Miller  MRN: [id number] DOB: February 10, 1951   Reason for Encounter: Chart Review For Initial Visit With Clinical Pharmacist   Conditions to be addressed/monitored: HTN, Dyslipidemia, DM I, Hypothyroidism, B 12 deficiency, Vitamin D deficiency  Primary concerns for visit include: HTN, Dyslipidemia, DM I, Hypothyroidism  Recent office visits:  11/16/2020 OV [facility] no further information available.  Recent consult visits:  08/16/2020 OV (Ophthalmology) Clent Jacks; no further information available.  08/07/2020 OV (Endocrinology) William Hamburger, MD; no medication changes indicated.  Hospital visits:  None in previous 6 months  Medications: Outpatient Encounter Medications as of 01/31/2021  Medication Sig   ascorbic acid (VITAMIN C) 500 MG tablet Take 500 mg by mouth daily.   aspirin EC 81 MG tablet Take 81 mg by mouth daily.   atorvastatin (LIPITOR) 20 MG tablet Take 1 tablet (20 mg total) by mouth daily.   Cholecalciferol (D3 HIGH POTENCY) 125 MCG (5000 UT) capsule Take 5,000 Units by mouth daily.   Continuous Blood Gluc Sensor (DEXCOM G6 SENSOR) MISC 1 Device by Does not apply route as directed.   Continuous Blood Gluc Transmit (DEXCOM G6 TRANSMITTER) MISC 1 Device by Does not apply route as directed.   desonide (DESOWEN) 0.05 % cream Apply topically 2 (two) times daily.   insulin aspart (NOVOLOG) 100 UNIT/ML injection USE UP TO 75 UNITS DAILY VIA INSULIN PUMP AS DIRECTED   Insulin Infusion Pump (T:SLIM INSULIN PUMP) DEVI by Does not apply route.   levothyroxine (SYNTHROID) 75 MCG tablet TAKE ONE TABLET BY MOUTH DAILY BEFORE BREAKFAST   lisinopril (ZESTRIL) 10 MG tablet TAKE ONE TABLET BY MOUTH DAILY   meloxicam (MOBIC) 7.5 MG tablet Take 1 tablet (7.5 mg total) by mouth daily.   No facility-administered encounter medications on file as of 01/31/2021.   Current Medications: Lisinopril 10 mg  last filled 01/04/2021 90 DS Levothyroxine 75 mcg last filled 12/13/2020 90 DS Atorvastatin 20 mg last filled 12/09/2020 90 DS Meloxicam 7.5 mg last filled 12/13/2020 30 DS Insulin Aspart (Novolog) 100 unit/mL last filled 01/16/2021 26 DS Desonide 0.05% cream last filled 04/07/2019 14 DS - no longer using Vitamin C 500 mg Insulin Infusion Pump Vitamin D3 Aspirin 81 mg - no longer taking One touch verio last filled 01/24/2021 66 DS Lancets last filled 12/13/2020 90 DS  Patient Questions: Any changes in your medications or health?  Any side effects from any medications?   Do you have any symptoms or problems not managed by your medications?  Any concerns about your health right now?  Has your provider asked that you check blood pressure, blood sugar, or follow special diet at home?  Do you get any type of exercise on a regular basis?  Can you think of a goal you would like to reach for your health?  Do you have any problems getting your medications?  Is there anything that you would like to discuss during the appointment?   Please bring medications and supplements to appointment  ** Patient states he has a new primary care provider and requested to cancel this appointment. He did not reschedule. **   Care Gaps: Medicare Annual Wellness: Due Now Ophthalmology Exam: Overdue since 05/27/2019 Foot Exam: Overdue since 10/01/2019 Hemoglobin A1C: 6.7% on 04/11/2020 Colonoscopy:  Fecal DNA (cologuard): Overdue since 12/02/2020   Future Appointments  Date Time Provider [place]  02/06/2021 11:00 AM [facility] CCM PHARMACIST [facility]  04/12/2021  1:20  PM Vivi Barrack, MD [facility]    Star Rating Drugs: Lisinopril 10 mg last filled 01/04/2021 90 DS Atorvastatin 20 mg last filled 12/09/2020 90 DS Insulin Aspart (Novolog) 100 unit/mL last filled 01/16/2021 26 DS  April D Calhoun, [place] Pharmacist Assistant [phone]

## 2021-02-06 ENCOUNTER — Other Ambulatory Visit: Payer: Self-pay | Admitting: Family Medicine

## 2021-02-06 ENCOUNTER — Ambulatory Visit: Payer: Medicare HMO

## 2021-02-12 DIAGNOSIS — R809 Proteinuria, unspecified: Secondary | ICD-10-CM | POA: Diagnosis not present

## 2021-02-12 DIAGNOSIS — Z9641 Presence of insulin pump (external) (internal): Secondary | ICD-10-CM | POA: Diagnosis not present

## 2021-02-12 DIAGNOSIS — E1029 Type 1 diabetes mellitus with other diabetic kidney complication: Secondary | ICD-10-CM | POA: Diagnosis not present

## 2021-02-12 DIAGNOSIS — E1042 Type 1 diabetes mellitus with diabetic polyneuropathy: Secondary | ICD-10-CM | POA: Diagnosis not present

## 2021-02-13 DIAGNOSIS — E1065 Type 1 diabetes mellitus with hyperglycemia: Secondary | ICD-10-CM | POA: Diagnosis not present

## 2021-02-23 ENCOUNTER — Other Ambulatory Visit: Payer: Self-pay | Admitting: *Deleted

## 2021-02-23 DIAGNOSIS — E1065 Type 1 diabetes mellitus with hyperglycemia: Secondary | ICD-10-CM | POA: Diagnosis not present

## 2021-02-23 MED ORDER — MELOXICAM 7.5 MG PO TABS: 7.5000 mg | ORAL_TABLET | Freq: Every day | ORAL | 1 refills | Status: DC

## 2021-02-26 ENCOUNTER — Ambulatory Visit: Payer: Medicare HMO | Admitting: Neurology

## 2021-03-01 DIAGNOSIS — G8929 Other chronic pain: Secondary | ICD-10-CM | POA: Diagnosis not present

## 2021-03-01 DIAGNOSIS — Z794 Long term (current) use of insulin: Secondary | ICD-10-CM | POA: Diagnosis not present

## 2021-03-01 DIAGNOSIS — G3184 Mild cognitive impairment, so stated: Secondary | ICD-10-CM | POA: Diagnosis not present

## 2021-03-01 DIAGNOSIS — I10 Essential (primary) hypertension: Secondary | ICD-10-CM | POA: Diagnosis not present

## 2021-03-01 DIAGNOSIS — E785 Hyperlipidemia, unspecified: Secondary | ICD-10-CM | POA: Diagnosis not present

## 2021-03-01 DIAGNOSIS — Z8249 Family history of ischemic heart disease and other diseases of the circulatory system: Secondary | ICD-10-CM | POA: Diagnosis not present

## 2021-03-01 DIAGNOSIS — E669 Obesity, unspecified: Secondary | ICD-10-CM | POA: Diagnosis not present

## 2021-03-01 DIAGNOSIS — E119 Type 2 diabetes mellitus without complications: Secondary | ICD-10-CM | POA: Diagnosis not present

## 2021-03-01 DIAGNOSIS — E039 Hypothyroidism, unspecified: Secondary | ICD-10-CM | POA: Diagnosis not present

## 2021-03-01 DIAGNOSIS — Z791 Long term (current) use of non-steroidal anti-inflammatories (NSAID): Secondary | ICD-10-CM | POA: Diagnosis not present

## 2021-03-15 DIAGNOSIS — E1065 Type 1 diabetes mellitus with hyperglycemia: Secondary | ICD-10-CM | POA: Diagnosis not present

## 2021-04-12 ENCOUNTER — Encounter: Payer: Medicare HMO | Admitting: Family Medicine

## 2021-04-12 NOTE — Progress Notes (Incomplete)
Chief Complaint:  Rodney Miller is a 71 y.o. male who presents today for his annual comprehensive physical exam.    Assessment/Plan:  New/Acute Problems: ***  Chronic Problems Addressed Today: No problem-specific Assessment & Plan notes found for this encounter.   There is no height or weight on file to calculate BMI. / ***    Preventative Healthcare: ***  Patient Counseling(The following topics were reviewed and/or handout was given):  -Nutrition: Stressed importance of moderation in sodium/caffeine intake, saturated fat and cholesterol, caloric balance, sufficient intake of fresh fruits, vegetables, and fiber.  -Stressed the importance of regular exercise.   -Substance Abuse: Discussed cessation/primary prevention of tobacco, alcohol, or other drug use; driving or other dangerous activities under the influence; availability of treatment for abuse.   -Injury prevention: Discussed safety belts, safety helmets, smoke detector, smoking near bedding or upholstery.   -Sexuality: Discussed sexually transmitted diseases, partner selection, use of condoms, avoidance of unintended pregnancy and contraceptive alternatives.   -Dental health: Discussed importance of regular tooth brushing, flossing, and dental visits.  -Health maintenance and immunizations reviewed. Please refer to Health maintenance section.  Return to care in 1 year for next preventative visit.     Subjective:  HPI:  He has ***no acute complaints today.   Lifestyle Diet: *** Exercise: ***  Depression screen [facility] 2/9 04/11/2020  Decreased Interest 0  Down, Depressed, Hopeless 0  PHQ - 2 Score 0  Altered sleeping -  Tired, decreased energy -  Change in appetite -  Feeling bad or failure about yourself  -  Trouble concentrating -  Moving slowly or fidgety/restless -  Suicidal thoughts -  PHQ-9 Score -    Health Maintenance Due  Topic Date Due   TETANUS/TDAP  Never done   Zoster Vaccines- Shingrix (1 of 2)  Never done   OPHTHALMOLOGY EXAM  05/27/2019   COVID-19 Vaccine (3 - Moderna risk series) 09/29/2019   FOOT EXAM  10/01/2019   HEMOGLOBIN A1C  10/09/2020   INFLUENZA VACCINE  Never done   Fecal DNA (Cologuard)  12/02/2020     ROS: Per HPI, otherwise a complete review of systems was negative.   PMH:  The following were reviewed and entered/updated in epic: Past Medical History:  Diagnosis Date   Diabetes mellitus without complication ([place])    Hypertension    Kidney stones    Patient Active Problem List   Diagnosis Date Noted   Vitamin D deficiency 04/11/2020   B12 deficiency 12/27/2019   Mass of right lower leg 04/09/2018   Dyslipidemia due to type 1 diabetes mellitus ([place]) 04/09/2018   Hypertension associated with diabetes ([place]) 04/09/2018   Mild cognitive impairment 04/09/2018   Type 1 diabetes mellitus with hyperglycemia ([place]) 01/30/2018   Hypothyroidism 04/01/2017   No past surgical history on file.  Family History  Problem Relation Age of Onset   Juvenile Diabetes Son    Juvenile Diabetes Grandchild    Prostate cancer Neg Hx    Bernards cancer Neg Hx     Medications- reviewed and updated Current Outpatient Medications  Medication Sig Dispense Refill   ascorbic acid (VITAMIN C) 500 MG tablet Take 500 mg by mouth daily.     aspirin EC 81 MG tablet Take 81 mg by mouth daily.     atorvastatin (LIPITOR) 20 MG tablet Take 1 tablet (20 mg total) by mouth daily. 90 tablet 2   Cholecalciferol (D3 HIGH POTENCY) 125 MCG (5000 UT) capsule Take 5,000 Units by mouth  daily.     Continuous Blood Gluc Sensor (DEXCOM G6 SENSOR) MISC 1 Device by Does not apply route as directed. 12 each 3   Continuous Blood Gluc Transmit (DEXCOM G6 TRANSMITTER) MISC 1 Device by Does not apply route as directed. 1 each 3   desonide (DESOWEN) 0.05 % cream Apply topically 2 (two) times daily. 30 g 0   insulin aspart (NOVOLOG) 100 UNIT/ML injection USE UP TO 75 UNITS DAILY VIA INSULIN PUMP AS DIRECTED 10  mL 0   Insulin Infusion Pump (T:SLIM INSULIN PUMP) DEVI by Does not apply route.     levothyroxine (SYNTHROID) 75 MCG tablet TAKE ONE TABLET BY MOUTH DAILY BEFORE BREAKFAST 90 tablet 0   lisinopril (ZESTRIL) 10 MG tablet TAKE ONE TABLET BY MOUTH DAILY 90 tablet 0   meloxicam (MOBIC) 7.5 MG tablet Take 1 tablet (7.5 mg total) by mouth daily. 30 tablet 1   No current facility-administered medications for this visit.    Allergies-reviewed and updated No Known Allergies  Social History   Socioeconomic History   Marital status: Married    Spouse name: Not on file   Number of children: Not on file   Years of education: 14   Highest education level: Not on file  Occupational History   Occupation: retired  Tobacco Use   Smoking status: Never   Smokeless tobacco: Never  Vaping Use   Vaping Use: Never used  Substance and Sexual Activity   Alcohol use: Not Currently   Drug use: Never   Sexual activity: Not on file  Other Topics Concern   Not on file  Social History Narrative   Right handed   One story home   Drinks caffeine   Social Determinants of Health   Financial Resource Strain: Not on file  Food Insecurity: Not on file  Transportation Needs: Not on file  Physical Activity: Not on file  Stress: Not on file  Social Connections: Not on file        Objective:  Physical Exam: There were no vitals taken for this visit.  There is no height or weight on file to calculate BMI. Wt Readings from Last 3 Encounters:  06/01/20 169 lb (76.7 kg)  04/11/20 164 lb 9.6 oz (74.7 kg)  12/27/19 165 lb 12.8 oz (75.2 kg)   Gen: NAD, resting comfortably*** HEENT: TMs normal bilaterally. OP clear. No thyromegaly noted.  CV: RRR with no murmurs appreciated Pulm: NWOB, CTAB with no crackles, wheezes, or rhonchi GI: Normal bowel sounds present. Soft, Nontender, Nondistended. MSK: no edema, cyanosis, or clubbing noted Skin: warm, dry Neuro: CN2-12 grossly intact. Strength 5/5 in upper  and lower extremities. Reflexes symmetric and intact bilaterally.  Psych: Normal affect and thought content      I,Savera Zaman,acting as a scribe for Dimas Chyle, MD.,have documented all relevant documentation on the behalf of Dimas Chyle, MD,as directed by  Dimas Chyle, MD while in the presence of Dimas Chyle, MD.   *** [name]. Jerline Pain, MD 04/12/2021 8:08 AM

## 2021-04-14 DIAGNOSIS — E1065 Type 1 diabetes mellitus with hyperglycemia: Secondary | ICD-10-CM | POA: Diagnosis not present

## 2021-05-14 DIAGNOSIS — E1065 Type 1 diabetes mellitus with hyperglycemia: Secondary | ICD-10-CM | POA: Diagnosis not present

## 2021-05-31 DIAGNOSIS — M549 Dorsalgia, unspecified: Secondary | ICD-10-CM | POA: Diagnosis not present

## 2021-05-31 DIAGNOSIS — E109 Type 1 diabetes mellitus without complications: Secondary | ICD-10-CM | POA: Diagnosis not present

## 2021-05-31 DIAGNOSIS — N4289 Other specified disorders of prostate: Secondary | ICD-10-CM | POA: Diagnosis not present

## 2021-05-31 DIAGNOSIS — Z9641 Presence of insulin pump (external) (internal): Secondary | ICD-10-CM | POA: Diagnosis not present

## 2021-05-31 DIAGNOSIS — I10 Essential (primary) hypertension: Secondary | ICD-10-CM | POA: Diagnosis not present

## 2021-05-31 DIAGNOSIS — E875 Hyperkalemia: Secondary | ICD-10-CM | POA: Diagnosis not present

## 2021-05-31 DIAGNOSIS — Z0000 Encounter for general adult medical examination without abnormal findings: Secondary | ICD-10-CM | POA: Diagnosis not present

## 2021-05-31 DIAGNOSIS — E785 Hyperlipidemia, unspecified: Secondary | ICD-10-CM | POA: Diagnosis not present

## 2021-05-31 DIAGNOSIS — Z87442 Personal history of urinary calculi: Secondary | ICD-10-CM | POA: Diagnosis not present

## 2021-05-31 DIAGNOSIS — G3184 Mild cognitive impairment, so stated: Secondary | ICD-10-CM | POA: Diagnosis not present

## 2021-05-31 DIAGNOSIS — Z8639 Personal history of other endocrine, nutritional and metabolic disease: Secondary | ICD-10-CM | POA: Diagnosis not present

## 2021-05-31 DIAGNOSIS — R69 Illness, unspecified: Secondary | ICD-10-CM | POA: Diagnosis not present

## 2021-05-31 DIAGNOSIS — E782 Mixed hyperlipidemia: Secondary | ICD-10-CM | POA: Diagnosis not present

## 2021-05-31 DIAGNOSIS — M25562 Pain in left knee: Secondary | ICD-10-CM | POA: Diagnosis not present

## 2021-05-31 DIAGNOSIS — E039 Hypothyroidism, unspecified: Secondary | ICD-10-CM | POA: Diagnosis not present

## 2021-05-31 DIAGNOSIS — R809 Proteinuria, unspecified: Secondary | ICD-10-CM | POA: Diagnosis not present

## 2021-06-01 DIAGNOSIS — E1065 Type 1 diabetes mellitus with hyperglycemia: Secondary | ICD-10-CM | POA: Diagnosis not present

## 2021-06-04 ENCOUNTER — Other Ambulatory Visit: Payer: Self-pay | Admitting: Family Medicine

## 2021-06-04 DIAGNOSIS — N200 Calculus of kidney: Secondary | ICD-10-CM

## 2021-06-13 DIAGNOSIS — E1065 Type 1 diabetes mellitus with hyperglycemia: Secondary | ICD-10-CM | POA: Diagnosis not present

## 2021-06-25 LAB — IFOBT (OCCULT BLOOD): IFOBT: NEGATIVE

## 2021-07-10 DIAGNOSIS — R2241 Localized swelling, mass and lump, right lower limb: Secondary | ICD-10-CM | POA: Diagnosis not present

## 2021-07-13 DIAGNOSIS — E1065 Type 1 diabetes mellitus with hyperglycemia: Secondary | ICD-10-CM | POA: Diagnosis not present

## 2021-07-27 DIAGNOSIS — N200 Calculus of kidney: Secondary | ICD-10-CM | POA: Diagnosis not present

## 2021-07-30 DIAGNOSIS — E1065 Type 1 diabetes mellitus with hyperglycemia: Secondary | ICD-10-CM | POA: Diagnosis not present

## 2021-07-30 DIAGNOSIS — E1029 Type 1 diabetes mellitus with other diabetic kidney complication: Secondary | ICD-10-CM | POA: Diagnosis not present

## 2021-07-30 DIAGNOSIS — E1040 Type 1 diabetes mellitus with diabetic neuropathy, unspecified: Secondary | ICD-10-CM | POA: Diagnosis not present

## 2021-07-30 DIAGNOSIS — R809 Proteinuria, unspecified: Secondary | ICD-10-CM | POA: Diagnosis not present

## 2021-07-30 DIAGNOSIS — Z9641 Presence of insulin pump (external) (internal): Secondary | ICD-10-CM | POA: Diagnosis not present

## 2021-08-07 DIAGNOSIS — E1169 Type 2 diabetes mellitus with other specified complication: Secondary | ICD-10-CM | POA: Diagnosis not present

## 2021-08-07 DIAGNOSIS — G3184 Mild cognitive impairment, so stated: Secondary | ICD-10-CM | POA: Diagnosis not present

## 2021-08-07 DIAGNOSIS — E1140 Type 2 diabetes mellitus with diabetic neuropathy, unspecified: Secondary | ICD-10-CM | POA: Diagnosis not present

## 2021-08-07 DIAGNOSIS — E1159 Type 2 diabetes mellitus with other circulatory complications: Secondary | ICD-10-CM | POA: Diagnosis not present

## 2021-08-07 DIAGNOSIS — Z7982 Long term (current) use of aspirin: Secondary | ICD-10-CM | POA: Diagnosis not present

## 2021-08-07 DIAGNOSIS — D1723 Benign lipomatous neoplasm of skin and subcutaneous tissue of right leg: Secondary | ICD-10-CM | POA: Diagnosis not present

## 2021-08-07 DIAGNOSIS — I152 Hypertension secondary to endocrine disorders: Secondary | ICD-10-CM | POA: Diagnosis not present

## 2021-08-07 DIAGNOSIS — R808 Other proteinuria: Secondary | ICD-10-CM | POA: Diagnosis not present

## 2021-08-07 DIAGNOSIS — Z596 Low income: Secondary | ICD-10-CM | POA: Diagnosis not present

## 2021-08-07 DIAGNOSIS — E039 Hypothyroidism, unspecified: Secondary | ICD-10-CM | POA: Diagnosis not present

## 2021-08-07 DIAGNOSIS — Z5941 Food insecurity: Secondary | ICD-10-CM | POA: Diagnosis not present

## 2021-08-07 DIAGNOSIS — D3610 Benign neoplasm of peripheral nerves and autonomic nervous system, unspecified: Secondary | ICD-10-CM | POA: Diagnosis not present

## 2021-08-07 DIAGNOSIS — Z733 Stress, not elsewhere classified: Secondary | ICD-10-CM | POA: Diagnosis not present

## 2021-08-07 DIAGNOSIS — R2241 Localized swelling, mass and lump, right lower limb: Secondary | ICD-10-CM | POA: Diagnosis not present

## 2021-08-07 DIAGNOSIS — Z5986 Financial insecurity: Secondary | ICD-10-CM | POA: Diagnosis not present

## 2021-08-07 DIAGNOSIS — I10 Essential (primary) hypertension: Secondary | ICD-10-CM | POA: Diagnosis not present

## 2021-08-07 DIAGNOSIS — D2371 Other benign neoplasm of skin of right lower limb, including hip: Secondary | ICD-10-CM | POA: Diagnosis not present

## 2021-08-07 DIAGNOSIS — Z79890 Hormone replacement therapy (postmenopausal): Secondary | ICD-10-CM | POA: Diagnosis not present

## 2021-08-07 DIAGNOSIS — Z79899 Other long term (current) drug therapy: Secondary | ICD-10-CM | POA: Diagnosis not present

## 2021-08-12 DIAGNOSIS — E1065 Type 1 diabetes mellitus with hyperglycemia: Secondary | ICD-10-CM | POA: Diagnosis not present

## 2021-08-21 DIAGNOSIS — N200 Calculus of kidney: Secondary | ICD-10-CM | POA: Diagnosis not present

## 2021-08-22 DIAGNOSIS — E119 Type 2 diabetes mellitus without complications: Secondary | ICD-10-CM | POA: Diagnosis not present

## 2021-08-22 DIAGNOSIS — H43813 Vitreous degeneration, bilateral: Secondary | ICD-10-CM | POA: Diagnosis not present

## 2021-08-22 DIAGNOSIS — H04123 Dry eye syndrome of bilateral lacrimal glands: Secondary | ICD-10-CM | POA: Diagnosis not present

## 2021-08-22 DIAGNOSIS — H02831 Dermatochalasis of right upper eyelid: Secondary | ICD-10-CM | POA: Diagnosis not present

## 2021-08-22 DIAGNOSIS — Z794 Long term (current) use of insulin: Secondary | ICD-10-CM | POA: Diagnosis not present

## 2021-08-22 DIAGNOSIS — H2513 Age-related nuclear cataract, bilateral: Secondary | ICD-10-CM | POA: Diagnosis not present

## 2021-08-22 DIAGNOSIS — H02834 Dermatochalasis of left upper eyelid: Secondary | ICD-10-CM | POA: Diagnosis not present

## 2021-08-23 ENCOUNTER — Other Ambulatory Visit: Payer: Self-pay | Admitting: Urology

## 2021-08-27 DIAGNOSIS — Z1211 Encounter for screening for malignant neoplasm of colon: Secondary | ICD-10-CM | POA: Diagnosis not present

## 2021-09-05 DIAGNOSIS — E1065 Type 1 diabetes mellitus with hyperglycemia: Secondary | ICD-10-CM | POA: Diagnosis not present

## 2021-09-06 ENCOUNTER — Ambulatory Visit (INDEPENDENT_AMBULATORY_CARE_PROVIDER_SITE_OTHER): Payer: Medicare HMO

## 2021-09-06 ENCOUNTER — Ambulatory Visit (HOSPITAL_BASED_OUTPATIENT_CLINIC_OR_DEPARTMENT_OTHER): Payer: Medicare HMO | Admitting: Orthopaedic Surgery

## 2021-09-06 DIAGNOSIS — M25511 Pain in right shoulder: Secondary | ICD-10-CM | POA: Diagnosis not present

## 2021-09-06 DIAGNOSIS — G8929 Other chronic pain: Secondary | ICD-10-CM

## 2021-09-06 DIAGNOSIS — M7501 Adhesive capsulitis of right shoulder: Secondary | ICD-10-CM | POA: Diagnosis not present

## 2021-09-06 DIAGNOSIS — M1711 Unilateral primary osteoarthritis, right knee: Secondary | ICD-10-CM | POA: Diagnosis not present

## 2021-09-06 MED ORDER — TRIAMCINOLONE ACETONIDE 40 MG/ML IJ SUSP
80.0000 mg | INTRAMUSCULAR | Status: AC | PRN
  Administered 2021-09-06: 80 mg via INTRA_ARTICULAR

## 2021-09-06 MED ORDER — LIDOCAINE HCL 1 % IJ SOLN
4.0000 mL | INTRAMUSCULAR | Status: AC | PRN
  Administered 2021-09-06: 4 mL

## 2021-09-06 NOTE — Progress Notes (Signed)
Chief Complaint: Right shoulder pain     History of Present Illness:    Rodney Miller is a 71 y.o. male presents today with right shoulder pain which has been ongoing for approximately 6 months.  He states that he has been lifting his son in and out of the wheelchair and as result is noticed decreased range of motion as well as pain in the shoulder.  He does have a history of diabetes.  He is overall active and enjoys doing yard work as well.  He has pain with overhead activities.  Here today for further assessment.    Surgical History:   None  PMH/PSH/Family History/Social History/Meds/Allergies:    Past Medical History:  Diagnosis Date  . Diabetes mellitus without complication ([place])   . Hypertension   . Kidney stones    No past surgical history on file. Social History   Socioeconomic History  . Marital status: Married    Spouse name: Not on file  . Number of children: Not on file  . Years of education: 44  . Highest education level: Not on file  Occupational History  . Occupation: retired  Tobacco Use  . Smoking status: Never  . Smokeless tobacco: Never  Vaping Use  . Vaping Use: Never used  Substance and Sexual Activity  . Alcohol use: Not Currently  . Drug use: Never  . Sexual activity: Not on file  Other Topics Concern  . Not on file  Social History Narrative   Right handed   One story home   Drinks caffeine   Social Determinants of Health   Financial Resource Strain: Not on file  Food Insecurity: Not on file  Transportation Needs: Not on file  Physical Activity: Not on file  Stress: Not on file  Social Connections: Not on file   Family History  Problem Relation Age of Onset  . Juvenile Diabetes Son   . Juvenile Diabetes Grandchild   . Prostate cancer Neg Hx   . Vivanco cancer Neg Hx    No Known Allergies Current Outpatient Medications  Medication Sig Dispense Refill  . ascorbic acid (VITAMIN C) 500 MG tablet  Take 500 mg by mouth daily.    Marland Kitchen aspirin EC 81 MG tablet Take 81 mg by mouth daily.    Marland Kitchen atorvastatin (LIPITOR) 20 MG tablet Take 1 tablet (20 mg total) by mouth daily. 90 tablet 2  . Cholecalciferol (D3 HIGH POTENCY) 125 MCG (5000 UT) capsule Take 5,000 Units by mouth daily.    . Continuous Blood Gluc Sensor (DEXCOM G6 SENSOR) MISC 1 Device by Does not apply route as directed. 12 each 3  . Continuous Blood Gluc Transmit (DEXCOM G6 TRANSMITTER) MISC 1 Device by Does not apply route as directed. 1 each 3  . desonide (DESOWEN) 0.05 % cream Apply topically 2 (two) times daily. 30 g 0  . insulin aspart (NOVOLOG) 100 UNIT/ML injection USE UP TO 75 UNITS DAILY VIA INSULIN PUMP AS DIRECTED 10 mL 0  . Insulin Infusion Pump (T:SLIM INSULIN PUMP) DEVI by Does not apply route.    Marland Kitchen levothyroxine (SYNTHROID) 75 MCG tablet TAKE ONE TABLET BY MOUTH DAILY BEFORE BREAKFAST 90 tablet 0  . lisinopril (ZESTRIL) 10 MG tablet TAKE ONE TABLET BY MOUTH DAILY 90 tablet 0  . meloxicam (MOBIC) 7.5 MG tablet  Take 1 tablet (7.5 mg total) by mouth daily. 30 tablet 1   No current facility-administered medications for this visit.   No results found.  Review of Systems:   A ROS was performed including pertinent positives and negatives as documented in the HPI.  Physical Exam :   Constitutional: NAD and appears stated age Neurological: Alert and oriented Psych: Appropriate affect and cooperative There were no vitals taken for this visit.   Comprehensive Musculoskeletal Exam:    Musculoskeletal Exam    Inspection Right Left  Skin No atrophy or winging No atrophy or winging  Palpation    Tenderness Lateral deltoid, glenohumeral None  Range of Motion    Flexion (passive) 150 170  Flexion (active) 140 170  Abduction 140 170  ER at the side 50 with pain 70  Can reach behind back to Back T12  Strength     Full with pain Full  Special Tests    Pseudoparalytic No No  Neurologic    Fires PIN, radial, median,  ulnar, musculocutaneous, axillary, suprascapular, long thoracic, and spinal accessory innervated muscles. No abnormal sensibility  Vascular/Lymphatic    Radial Pulse 2+ 2+  Cervical Exam    Patient has symmetric cervical range of motion with negative Spurling's test.  Special Test: Positive pain with external rotation passively at side     Imaging:   Xray (3 view right shoulder): Normal   I personally reviewed and interpreted the radiographs.   Assessment:   71 y.o. male right dominant male presents with right shoulder consistent with adhesive capsulitis.  At today's visit I recommended ultrasound-guided glenohumeral injection.  I will plan to see him back in 2 weeks for reassessment.  At this time he is advised that doing Well, Exercises  Plan :    -Right shoulder glenohumeral injection performed after verbal consent obtained     Procedure Note  Patient: Rodney Miller             Date of Birth: [birth year]           MRN: [id number]             Visit Date: 09/06/2021  Procedures: Visit Diagnoses:  1. Chronic right shoulder pain     Large Joint Inj: R glenohumeral on 09/06/2021 2:32 PM Indications: pain Details: 22 G 1.5 in needle, ultrasound-guided anterior approach  Arthrogram: No  Medications: 4 mL lidocaine 1 %; 80 mg triamcinolone acetonide 40 MG/ML Outcome: tolerated well, no immediate complications Procedure, treatment alternatives, risks and benefits explained, specific risks discussed. Consent was given by the patient. Immediately prior to procedure a time out was called to verify the correct patient, procedure, equipment, support staff and site/side marked as required. Patient was prepped and draped in the usual sterile fashion.          I personally saw and evaluated the patient, and participated in the management and treatment plan.  Vanetta Mulders, MD Attending Physician, Orthopedic Surgery  This document was dictated using Dragon voice recognition  software. A reasonable attempt at proof reading has been made to minimize errors.

## 2021-09-11 DIAGNOSIS — E1065 Type 1 diabetes mellitus with hyperglycemia: Secondary | ICD-10-CM | POA: Diagnosis not present

## 2021-09-19 ENCOUNTER — Ambulatory Visit (HOSPITAL_BASED_OUTPATIENT_CLINIC_OR_DEPARTMENT_OTHER): Payer: Medicare HMO | Admitting: Orthopaedic Surgery

## 2021-09-25 DIAGNOSIS — G3184 Mild cognitive impairment, so stated: Secondary | ICD-10-CM | POA: Diagnosis not present

## 2021-09-25 DIAGNOSIS — F330 Major depressive disorder, recurrent, mild: Secondary | ICD-10-CM | POA: Diagnosis not present

## 2021-09-25 DIAGNOSIS — F329 Major depressive disorder, single episode, unspecified: Secondary | ICD-10-CM | POA: Diagnosis not present

## 2021-09-25 DIAGNOSIS — R4189 Other symptoms and signs involving cognitive functions and awareness: Secondary | ICD-10-CM | POA: Diagnosis not present

## 2021-09-25 DIAGNOSIS — R69 Illness, unspecified: Secondary | ICD-10-CM | POA: Diagnosis not present

## 2021-09-25 DIAGNOSIS — Z7189 Other specified counseling: Secondary | ICD-10-CM | POA: Diagnosis not present

## 2021-09-25 DIAGNOSIS — E039 Hypothyroidism, unspecified: Secondary | ICD-10-CM | POA: Diagnosis not present

## 2021-09-27 ENCOUNTER — Other Ambulatory Visit (HOSPITAL_COMMUNITY): Payer: Medicare HMO

## 2021-09-27 ENCOUNTER — Ambulatory Visit (INDEPENDENT_AMBULATORY_CARE_PROVIDER_SITE_OTHER): Payer: Medicare HMO | Admitting: Orthopaedic Surgery

## 2021-09-27 DIAGNOSIS — M25511 Pain in right shoulder: Secondary | ICD-10-CM

## 2021-09-27 DIAGNOSIS — M7501 Adhesive capsulitis of right shoulder: Secondary | ICD-10-CM

## 2021-09-27 MED ORDER — TRIAMCINOLONE ACETONIDE 40 MG/ML IJ SUSP
80.0000 mg | INTRAMUSCULAR | Status: AC | PRN
  Administered 2021-09-27: 80 mg via INTRA_ARTICULAR

## 2021-09-27 MED ORDER — LIDOCAINE HCL 1 % IJ SOLN
4.0000 mL | INTRAMUSCULAR | Status: AC | PRN
  Administered 2021-09-27: 4 mL

## 2021-09-27 NOTE — Progress Notes (Signed)
Chief Complaint: Right shoulder pain     History of Present Illness:   09/27/2021: Presents today for follow-up of his right adhesive capsulitis.  Overall he did do very well with the first injection.  He is hoping to get another injection to help optimize his range of motion.  Rodney Miller is a 71 y.o. male presents today with right shoulder pain which has been ongoing for approximately 6 months.  He states that he has been lifting his son in and out of the wheelchair and as result is noticed decreased range of motion as well as pain in the shoulder.  He does have a history of diabetes.  He is overall active and enjoys doing yard work as well.  He has pain with overhead activities.  Here today for further assessment.    Surgical History:   None  PMH/PSH/Family History/Social History/Meds/Allergies:    Past Medical History:  Diagnosis Date   Diabetes mellitus without complication ([place])    Hypertension    Kidney stones    No past surgical history on file. Social History   Socioeconomic History   Marital status: Married    Spouse name: Not on file   Number of children: Not on file   Years of education: 14   Highest education level: Not on file  Occupational History   Occupation: retired  Tobacco Use   Smoking status: Never   Smokeless tobacco: Never  Vaping Use   Vaping Use: Never used  Substance and Sexual Activity   Alcohol use: Not Currently   Drug use: Never   Sexual activity: Not on file  Other Topics Concern   Not on file  Social History Narrative   Right handed   One story home   Drinks caffeine   Social Determinants of Health   Financial Resource Strain: Not on file  Food Insecurity: Not on file  Transportation Needs: Not on file  Physical Activity: Not on file  Stress: Not on file  Social Connections: Not on file   Family History  Problem Relation Age of Onset   Juvenile Diabetes Son    Juvenile Diabetes Grandchild     Prostate cancer Neg Hx    [place] cancer Neg Hx    No Known Allergies Current Outpatient Medications  Medication Sig Dispense Refill   ascorbic acid (VITAMIN C) 500 MG tablet Take 500 mg by mouth daily.     aspirin EC 81 MG tablet Take 81 mg by mouth daily.     atorvastatin (LIPITOR) 20 MG tablet Take 1 tablet (20 mg total) by mouth daily. 90 tablet 2   Cholecalciferol (D3 HIGH POTENCY) 125 MCG (5000 UT) capsule Take 5,000 Units by mouth daily.     Continuous Blood Gluc Sensor (DEXCOM G6 SENSOR) MISC 1 Device by Does not apply route as directed. 12 each 3   Continuous Blood Gluc Transmit (DEXCOM G6 TRANSMITTER) MISC 1 Device by Does not apply route as directed. 1 each 3   desonide (DESOWEN) 0.05 % cream Apply topically 2 (two) times daily. 30 g 0   insulin aspart (NOVOLOG) 100 UNIT/ML injection USE UP TO 75 UNITS DAILY VIA INSULIN PUMP AS DIRECTED 10 mL 0   Insulin Infusion Pump (T:SLIM INSULIN PUMP) DEVI by Does not apply route.     levothyroxine (SYNTHROID) 75  MCG tablet TAKE ONE TABLET BY MOUTH DAILY BEFORE BREAKFAST 90 tablet 0   lisinopril (ZESTRIL) 10 MG tablet TAKE ONE TABLET BY MOUTH DAILY 90 tablet 0   meloxicam (MOBIC) 7.5 MG tablet Take 1 tablet (7.5 mg total) by mouth daily. 30 tablet 1   No current facility-administered medications for this visit.   No results found.  Review of Systems:   A ROS was performed including pertinent positives and negatives as documented in the HPI.  Physical Exam :   Constitutional: NAD and appears stated age Neurological: Alert and oriented Psych: Appropriate affect and cooperative There were no vitals taken for this visit.   Comprehensive Musculoskeletal Exam:    Musculoskeletal Exam    Inspection Right Left  Skin No atrophy or winging No atrophy or winging  Palpation    Tenderness Lateral deltoid, glenohumeral None  Range of Motion    Flexion (passive) 170 170  Flexion (active) 170 170  Abduction 160 170  ER at the side 60 70   Can reach behind back to 3 T12  Strength     Full with pain Full  Special Tests    Pseudoparalytic No No  Neurologic    Fires PIN, radial, median, ulnar, musculocutaneous, axillary, suprascapular, long thoracic, and spinal accessory innervated muscles. No abnormal sensibility  Vascular/Lymphatic    Radial Pulse 2+ 2+  Cervical Exam    Patient has symmetric cervical range of motion with negative Spurling's test.  Special Test:      Imaging:   Xray (3 view right shoulder): Normal   I personally reviewed and interpreted the radiographs.   Assessment:   71 y.o. male right dominant male presents with right shoulder consistent with adhesive capsulitis.  Overall he is doing much better at today's visit although he is hoping to get an additional injection so we can hopefully optimize his mobility.  I will plan to see him back in 2 weeks and at that time we will consider if he needs additional physical therapy  Plan :    -Return to clinic in 2 weeks for reassessment     Procedure Note  Patient: Rodney Miller             Date of Birth: [birth year]           MRN: [id number]             Visit Date: 09/27/2021  Procedures: Visit Diagnoses:  No diagnosis found.   Large Joint Inj: R glenohumeral on 09/27/2021 10:57 AM Indications: pain Details: 22 G 1.5 in needle, ultrasound-guided anterior approach  Arthrogram: No  Medications: 4 mL lidocaine 1 %; 80 mg triamcinolone acetonide 40 MG/ML Outcome: tolerated well, no immediate complications Procedure, treatment alternatives, risks and benefits explained, specific risks discussed. Consent was given by the patient. Immediately prior to procedure a time out was called to verify the correct patient, procedure, equipment, support staff and site/side marked as required. Patient was prepped and draped in the usual sterile fashion.          I personally saw and evaluated the patient, and participated in the management and treatment  plan.  Vanetta Mulders, MD Attending Physician, Orthopedic Surgery  This document was dictated using Dragon voice recognition software. A reasonable attempt at proof reading has been made to minimize errors.

## 2021-10-03 ENCOUNTER — Other Ambulatory Visit (HOSPITAL_COMMUNITY): Payer: Self-pay | Admitting: Urology

## 2021-10-03 ENCOUNTER — Other Ambulatory Visit: Payer: Self-pay | Admitting: Urology

## 2021-10-03 DIAGNOSIS — N201 Calculus of ureter: Secondary | ICD-10-CM

## 2021-10-05 ENCOUNTER — Ambulatory Visit: Admit: 2021-10-05 | Payer: Medicare HMO | Admitting: Urology

## 2021-10-05 SURGERY — NEPHROLITHOTOMY PERCUTANEOUS
Anesthesia: General | Laterality: Left

## 2021-10-11 ENCOUNTER — Ambulatory Visit (INDEPENDENT_AMBULATORY_CARE_PROVIDER_SITE_OTHER): Payer: Medicare HMO | Admitting: Orthopaedic Surgery

## 2021-10-11 DIAGNOSIS — E1065 Type 1 diabetes mellitus with hyperglycemia: Secondary | ICD-10-CM | POA: Diagnosis not present

## 2021-10-11 DIAGNOSIS — M7501 Adhesive capsulitis of right shoulder: Secondary | ICD-10-CM | POA: Diagnosis not present

## 2021-10-11 NOTE — Progress Notes (Signed)
Chief Complaint: Right shoulder pain     History of Present Illness:   10/11/2021: Presents today for follow-up of his right adhesive capsulitis.  That he has complete relief following his second injection with completely normalized range of motion.  He is very happy with the shoulder at this time.  Rodney Miller is a 71 y.o. male presents today with right shoulder pain which has been ongoing for approximately 6 months.  He states that he has been lifting his son in and out of the wheelchair and as result is noticed decreased range of motion as well as pain in the shoulder.  He does have a history of diabetes.  He is overall active and enjoys doing yard work as well.  He has pain with overhead activities.  Here today for further assessment.    Surgical History:   None  PMH/PSH/Family History/Social History/Meds/Allergies:    Past Medical History:  Diagnosis Date   Diabetes mellitus without complication ([place])    Hypertension    Kidney stones    No past surgical history on file. Social History   Socioeconomic History   Marital status: Married    Spouse name: Not on file   Number of children: Not on file   Years of education: 14   Highest education level: Not on file  Occupational History   Occupation: retired  Tobacco Use   Smoking status: Never   Smokeless tobacco: Never  Vaping Use   Vaping Use: Never used  Substance and Sexual Activity   Alcohol use: Not Currently   Drug use: Never   Sexual activity: Not on file  Other Topics Concern   Not on file  Social History Narrative   Right handed   One story home   Drinks caffeine   Social Determinants of Health   Financial Resource Strain: Not on file  Food Insecurity: Not on file  Transportation Needs: Not on file  Physical Activity: Not on file  Stress: Not on file  Social Connections: Not on file   Family History  Problem Relation Age of Onset   Juvenile Diabetes Son     Juvenile Diabetes Grandchild    Prostate cancer Neg Hx    [place] cancer Neg Hx    No Known Allergies Current Outpatient Medications  Medication Sig Dispense Refill   ascorbic acid (VITAMIN C) 500 MG tablet Take 500 mg by mouth daily.     aspirin EC 81 MG tablet Take 81 mg by mouth daily.     atorvastatin (LIPITOR) 20 MG tablet Take 1 tablet (20 mg total) by mouth daily. 90 tablet 2   Cholecalciferol (D3 HIGH POTENCY) 125 MCG (5000 UT) capsule Take 5,000 Units by mouth daily.     Continuous Blood Gluc Sensor (DEXCOM G6 SENSOR) MISC 1 Device by Does not apply route as directed. 12 each 3   Continuous Blood Gluc Transmit (DEXCOM G6 TRANSMITTER) MISC 1 Device by Does not apply route as directed. 1 each 3   desonide (DESOWEN) 0.05 % cream Apply topically 2 (two) times daily. 30 g 0   insulin aspart (NOVOLOG) 100 UNIT/ML injection USE UP TO 75 UNITS DAILY VIA INSULIN PUMP AS DIRECTED 10 mL 0   Insulin Infusion Pump (T:SLIM INSULIN PUMP) DEVI by Does not apply route.     levothyroxine (SYNTHROID)  75 MCG tablet TAKE ONE TABLET BY MOUTH DAILY BEFORE BREAKFAST 90 tablet 0   lisinopril (ZESTRIL) 10 MG tablet TAKE ONE TABLET BY MOUTH DAILY 90 tablet 0   meloxicam (MOBIC) 7.5 MG tablet Take 1 tablet (7.5 mg total) by mouth daily. 30 tablet 1   No current facility-administered medications for this visit.   No results found.  Review of Systems:   A ROS was performed including pertinent positives and negatives as documented in the HPI.  Physical Exam :   Constitutional: NAD and appears stated age Neurological: Alert and oriented Psych: Appropriate affect and cooperative There were no vitals taken for this visit.   Comprehensive Musculoskeletal Exam:    Musculoskeletal Exam    Inspection Right Left  Skin No atrophy or winging No atrophy or winging  Palpation    Tenderness Plan None  Range of Motion    Flexion (passive) 170 170  Flexion (active) 170 170  Abduction 160 170  ER at the side 70  70  Can reach behind back to T12 T12  Strength     Full with pain Full  Special Tests    Pseudoparalytic No No  Neurologic    Fires PIN, radial, median, ulnar, musculocutaneous, axillary, suprascapular, long thoracic, and spinal accessory innervated muscles. No abnormal sensibility  Vascular/Lymphatic    Radial Pulse 2+ 2+  Cervical Exam    Patient has symmetric cervical range of motion with negative Spurling's test.  Special Test:      Imaging:   Xray (3 view right shoulder): Normal   I personally reviewed and interpreted the radiographs.   Assessment:   71 y.o. male right dominant male presents with right shoulder consistent with adhesive capsulitis.  This has completely resolved at this time.  I will plan to see him back on an as-needed basis  Plan :    -Return to clinic as needed      I personally saw and evaluated the patient, and participated in the management and treatment plan.  Vanetta Mulders, MD Attending Physician, Orthopedic Surgery  This document was dictated using Dragon voice recognition software. A reasonable attempt at proof reading has been made to minimize errors.

## 2021-10-16 DIAGNOSIS — Z0184 Encounter for antibody response examination: Secondary | ICD-10-CM | POA: Diagnosis not present

## 2021-10-19 DIAGNOSIS — D3613 Benign neoplasm of peripheral nerves and autonomic nervous system of lower limb, including hip: Secondary | ICD-10-CM | POA: Diagnosis not present

## 2021-10-19 DIAGNOSIS — D3610 Benign neoplasm of peripheral nerves and autonomic nervous system, unspecified: Secondary | ICD-10-CM | POA: Diagnosis not present

## 2021-11-10 DIAGNOSIS — E1065 Type 1 diabetes mellitus with hyperglycemia: Secondary | ICD-10-CM | POA: Diagnosis not present

## 2021-11-20 DIAGNOSIS — N200 Calculus of kidney: Secondary | ICD-10-CM | POA: Diagnosis not present

## 2021-11-23 NOTE — Patient Instructions (Signed)
SURGICAL WAITING ROOM VISITATION Patients having surgery or a procedure may have no more than 2 support people in the waiting area - these visitors may rotate.   Children under the age of 68 must have an adult with them who is not the patient. If the patient needs to stay at the hospital during part of their recovery, the visitor guidelines for inpatient rooms apply. Pre-op nurse will coordinate an appropriate time for 1 support person to accompany patient in pre-op.  This support person may not rotate.    Please refer to the [facility] website for the visitor guidelines for Inpatients (after your surgery is over and you are in a regular room).     Your procedure is scheduled on: 12/07/21   Report to [facility] Main Entrance    Report to admitting at 7:30 AM   Call this number if you have problems the morning of surgery [phone]   Do not eat food or liquids :After Midnight.  FOLLOW BOWEL PREP AND ANY ADDITIONAL PRE OP INSTRUCTIONS YOU RECEIVED FROM YOUR SURGEON'S OFFICE!!!     Oral Hygiene is also important to reduce your risk of infection.                                    Remember - BRUSH YOUR TEETH THE MORNING OF SURGERY WITH YOUR REGULAR TOOTHPASTE   Take these medicines the morning of surgery with A SIP OF WATER: Atorvastatin, Celexa, Levothyroxine, Tramadol.   DO NOT TAKE ANY ORAL DIABETIC MEDICATIONS DAY OF YOUR SURGERY  How to Manage Your Diabetes Before and After Surgery  Why is it important to control my blood sugar before and after surgery? Improving blood sugar levels before and after surgery helps healing and can limit problems. A way of improving blood sugar control is eating a healthy diet by:  Eating less sugar and carbohydrates  Increasing activity/exercise  Talking with your doctor about reaching your blood sugar goals High blood sugars (greater than 180 mg/dL) can raise your risk of infections and slow your recovery, so you will need to focus on  controlling your diabetes during the weeks before surgery. Make sure that the doctor who takes care of your diabetes knows about your planned surgery including the date and location.  How do I manage my blood sugar before surgery? Check your blood sugar at least 4 times a day, starting 2 days before surgery, to make sure that the level is not too high or low. Check your blood sugar the morning of your surgery when you wake up and every 2 hours until you get to the Short Stay unit. If your blood sugar is less than 70 mg/dL, you will need to treat for low blood sugar: Do not take insulin. Treat a low blood sugar (less than 70 mg/dL) with  cup of clear juice (cranberry or apple), 4 glucose tablets, OR glucose gel. Recheck blood sugar in 15 minutes after treatment (to make sure it is greater than 70 mg/dL). If your blood sugar is not greater than 70 mg/dL on recheck, call [phone] for further instructions. Report your blood sugar to the short stay nurse when you get to Short Stay.  If you are admitted to the hospital after surgery: Your blood sugar will be checked by the staff and you will probably be given insulin after surgery (instead of oral diabetes medicines) to make sure you have good  blood sugar levels. The goal for blood sugar control after surgery is 80-180 mg/dL.   WHAT DO I DO ABOUT MY DIABETES MEDICATION?  Do not take oral diabetes medicines (pills) the morning of surgery.  The day of surgery, do not take other diabetes injectables, including Byetta (exenatide), Bydureon (exenatide ER), Victoza (liraglutide), or Trulicity (dulaglutide).  If your CBG is greater than 220 mg/dL, you may take  of your sliding scale  (correction) dose of insulin.    For patients with insulin pumps: Contact your diabetes doctor for specific instructions before surgery. Decrease basal rates by 20% at midnight the night before your surgery. Note that if your surgery is planned to be longer than 2  hours, your insulin pump will be removed and intravenous (IV) insulin will be started and managed by the nurses and the anesthesiologist. You will be able to restart your insulin pump once you are awake and able to manage it.  Make sure to bring insulin pump supplies to the hospital with you in case the  site needs to be changed.  Reviewed and Endorsed by [facility] Patient Education Committee, August 2015                               You may not have any metal on your body including jewelry, and body piercing             Do not wear lotions, powders, cologne, or deodorant              Men may shave face and neck.   Do not bring valuables to the hospital. [place].   Bring small overnight bag day of surgery.   DO NOT [place]. PHARMACY WILL DISPENSE MEDICATIONS LISTED ON YOUR MEDICATION LIST TO YOU DURING YOUR ADMISSION [place]!    Special Instructions: Bring a copy of your healthcare power of attorney and living will documents         the day of surgery if you haven't scanned them before.              Please read over the following fact sheets you were given: IF YOU HAVE QUESTIONS ABOUT YOUR PRE-OP INSTRUCTIONS PLEASE CALL [facility] - Preparing for Surgery Before surgery, you can play an important role.  Because skin is not sterile, your skin needs to be as free of germs as possible.  You can reduce the number of germs on your skin by washing with CHG (chlorahexidine gluconate) soap before surgery.  CHG is an antiseptic cleaner which kills germs and bonds with the skin to continue killing germs even after washing. Please DO NOT use if you have an allergy to CHG or antibacterial soaps.  If your skin becomes reddened/irritated stop using the CHG and inform your nurse when you arrive at Short Stay. Do not shave (including legs and underarms) for at least 48 hours prior to  the first CHG shower.  You may shave your face/neck.  Please follow these instructions carefully:  1.  Shower with CHG Soap the night before surgery and the  morning of surgery.  2.  If you choose to wash your hair, wash your hair first as usual with your normal  shampoo.  3.  After  you shampoo, rinse your hair and body thoroughly to remove the shampoo.                             4.  Use CHG as you would any other liquid soap.  You can apply chg directly to the skin and wash.  Gently with a scrungie or clean washcloth.  5.  Apply the CHG Soap to your body ONLY FROM THE NECK DOWN.   Do   not use on face/ open                           Wound or open sores. Avoid contact with eyes, ears mouth and   genitals (private parts).                       Wash face,  Genitals (private parts) with your normal soap.             6.  Wash thoroughly, paying special attention to the area where your    surgery  will be performed.  7.  Thoroughly rinse your body with warm water from the neck down.  8.  DO NOT shower/wash with your normal soap after using and rinsing off the CHG Soap.                9.  Pat yourself dry with a clean towel.            10.  Wear clean pajamas.            11.  Place clean sheets on your bed the night of your first shower and do not  sleep with pets. Day of Surgery : Do not apply any lotions/deodorants the morning of surgery.  Please wear clean clothes to the hospital/surgery center.  FAILURE TO FOLLOW THESE INSTRUCTIONS MAY RESULT IN THE CANCELLATION OF YOUR SURGERY  PATIENT SIGNATURE_________________________________  NURSE SIGNATURE__________________________________  ________________________________________________________________________  WHAT IS A BLOOD TRANSFUSION? Blood Transfusion Information  A transfusion is the replacement of blood or some of its parts. Blood is made up of multiple cells which provide different functions. Red blood cells carry oxygen and are used for  blood loss replacement. White blood cells fight against infection. Platelets control bleeding. Plasma helps clot blood. Other blood products are available for specialized needs, such as hemophilia or other clotting disorders. BEFORE THE TRANSFUSION  Who gives blood for transfusions?  Healthy volunteers who are fully evaluated to make sure their blood is safe. This is blood bank blood. Transfusion therapy is the safest it has ever been in the practice of medicine. Before blood is taken from a donor, a complete history is taken to make sure that person has no history of diseases nor engages in risky social behavior (examples are intravenous drug use or sexual activity with multiple partners). The donor's travel history is screened to minimize risk of transmitting infections, such as malaria. The donated blood is tested for signs of infectious diseases, such as HIV and hepatitis. The blood is then tested to be sure it is compatible with you in order to minimize the chance of a transfusion reaction. If you or a relative donates blood, this is often done in anticipation of surgery and is not appropriate for emergency situations. It takes many days to process the donated blood. RISKS AND COMPLICATIONS Although transfusion therapy is very safe and saves many  lives, the main dangers of transfusion include:  Getting an infectious disease. Developing a transfusion reaction. This is an allergic reaction to something in the blood you were given. Every precaution is taken to prevent this. The decision to have a blood transfusion has been considered carefully by your caregiver before blood is given. Blood is not given unless the benefits outweigh the risks. AFTER THE TRANSFUSION Right after receiving a blood transfusion, you will usually feel much better and more energetic. This is especially true if your red blood cells have gotten low (anemic). The transfusion raises the level of the red blood cells which carry  oxygen, and this usually causes an energy increase. The nurse administering the transfusion will monitor you carefully for complications. HOME CARE INSTRUCTIONS  No special instructions are needed after a transfusion. You may find your energy is better. Speak with your caregiver about any limitations on activity for underlying diseases you may have. SEEK MEDICAL CARE IF:  Your condition is not improving after your transfusion. You develop redness or irritation at the intravenous (IV) site. SEEK IMMEDIATE MEDICAL CARE IF:  Any of the following symptoms occur over the next 12 hours: Shaking chills. You have a temperature by mouth above 102 F (38.9 C), not controlled by medicine. Chest, back, or muscle pain. People around you feel you are not acting correctly or are confused. Shortness of breath or difficulty breathing. Dizziness and fainting. You get a rash or develop hives. You have a decrease in urine output. Your urine turns a dark color or changes to pink, red, or brown. Any of the following symptoms occur over the next 10 days: You have a temperature by mouth above 102 F (38.9 C), not controlled by medicine. Shortness of breath. Weakness after normal activity. The white part of the eye turns yellow (jaundice). You have a decrease in the amount of urine or are urinating less often. Your urine turns a dark color or changes to pink, red, or brown. Document Released: 03/01/2000 Document Revised: 05/27/2011 Document Reviewed: 10/19/2007 [facility] Patient Information 2014 [place], [place].  _______________________________________________________________________

## 2021-11-23 NOTE — Progress Notes (Addendum)
COVID Vaccine Completed: yes x2  Date of COVID positive in last 90 days: no  PCP - Systems analyst - n/a  Chest x-ray - n/a EKG - 11/26/21 Epic/chart Stress Test - n/a ECHO - n/a Cardiac Cath - n/a Pacemaker/ICD device last checked: n/a Spinal Cord Stimulator: n/a  Bowel Prep - no  Sleep Study - n/a CPAP -   Fasting Blood Sugar - DM1 70-212 Checks Blood Sugar dexcom, currently is doing needle sticks   Blood Thinner Instructions: n/a Aspirin Instructions: Last Dose:  Activity level: Can go up a flight of stairs and perform activities of daily living without stopping and without symptoms of chest pain or shortness of breath.   Anesthesia review: DM1, insulin pump, HTN  Patient denies shortness of breath, fever, cough and chest pain at PAT appointment  Patient verbalized understanding of instructions that were given to them at the PAT appointment. Patient was also instructed that they will need to review over the PAT instructions again at home before surgery.

## 2021-11-26 ENCOUNTER — Encounter (HOSPITAL_COMMUNITY)
Admission: RE | Admit: 2021-11-26 | Discharge: 2021-11-26 | Disposition: A | Discharge status: Home / Self Care | Payer: Medicare HMO | Source: Ambulatory Visit | Attending: Urology | Admitting: Urology

## 2021-11-26 ENCOUNTER — Encounter (HOSPITAL_COMMUNITY): Payer: Self-pay

## 2021-11-26 VITALS — BP 153/84 | HR 63 | Temp 98.2°F | Resp 12 | Ht 66.0 in | Wt 183.0 lb

## 2021-11-26 DIAGNOSIS — Z01818 Encounter for other preprocedural examination: Secondary | ICD-10-CM | POA: Insufficient documentation

## 2021-11-26 DIAGNOSIS — E1065 Type 1 diabetes mellitus with hyperglycemia: Secondary | ICD-10-CM | POA: Insufficient documentation

## 2021-11-26 HISTORY — DX: Hypothyroidism, unspecified: E03.9

## 2021-11-26 LAB — TYPE AND SCREEN
ABO/RH(D): O POS
Antibody Screen: NEGATIVE

## 2021-11-26 LAB — CBC
HCT: 41.5 % (ref 39.0–52.0)
Hemoglobin: 13.6 g/dL (ref 13.0–17.0)
MCH: 32.5 pg (ref 26.0–34.0)
MCHC: 32.8 g/dL (ref 30.0–36.0)
MCV: 99.3 fL (ref 80.0–100.0)
Platelets: 275 10*3/uL (ref 150–400)
RBC: 4.18 MIL/uL — ABNORMAL LOW (ref 4.22–5.81)
RDW: 12.8 % (ref 11.5–15.5)
WBC: 7.4 10*3/uL (ref 4.0–10.5)
nRBC: 0 % (ref 0.0–0.2)

## 2021-11-26 LAB — BASIC METABOLIC PANEL
Anion gap: 5 (ref 5–15)
BUN: 13 mg/dL (ref 8–23)
CO2: 29 mmol/L (ref 22–32)
Calcium: 9.2 mg/dL (ref 8.9–10.3)
Chloride: 103 mmol/L (ref 98–111)
Creatinine, Ser: 1.01 mg/dL (ref 0.61–1.24)
GFR, Estimated: >60 mL/min (ref >60)
Glucose, Bld: 132 mg/dL — ABNORMAL HIGH (ref 70–99)
Potassium: 4.3 mmol/L (ref 3.5–5.1)
Sodium: 137 mmol/L (ref 135–145)

## 2021-11-26 LAB — GLUCOSE, CAPILLARY: Glucose-Capillary: 189 mg/dL — ABNORMAL HIGH (ref 70–99)

## 2021-11-26 LAB — HEMOGLOBIN A1C
Hgb A1c MFr Bld: 7.5 % — ABNORMAL HIGH (ref 4.8–5.6)
Mean Plasma Glucose: 168.55 mg/dL

## 2021-11-28 DIAGNOSIS — E1065 Type 1 diabetes mellitus with hyperglycemia: Secondary | ICD-10-CM | POA: Diagnosis not present

## 2021-12-04 NOTE — H&P (Signed)
Chief Complaint: Patient was seen in consultation today for left renal calculus  at the request of Rodney Miller  Referring Physician(s): Irine Seal  Supervising Physician: Arne Cleveland  Patient Status: [facility] - Out-pt  History of Present Illness: Rodney Miller is a 71 y.o. male patient is followed by nephrology after having left flank pain and findings of 28 x 17 mm stone in left kidney.  Patient has been referred by Irine Seal, MD for left percutaneous nephrostomy tube placement for OR intervention today to treat left renal calculus.  Past Medical History:  Diagnosis Date   Diabetes mellitus without complication ([place])    Hypertension    Hypothyroidism    Kidney stones     Past Surgical History:  Procedure Laterality Date   CYST EXCISION Right    leg   LITHOTRIPSY      Allergies: Patient has no known allergies.  Medications: Prior to Admission medications   Medication Sig Start Date End Date Taking? Authorizing Provider  atorvastatin (LIPITOR) 20 MG tablet Take 1 tablet (20 mg total) by mouth daily. 09/26/20   Vivi Barrack, MD  CELEXA 20 MG tablet Take 20 mg by mouth daily. 09/25/21   [provider]  Continuous Blood Gluc Sensor (DEXCOM G6 SENSOR) MISC 1 Device by Does not apply route as directed. 07/27/19   Shamleffer, Melanie Crazier, MD  Continuous Blood Gluc Transmit (DEXCOM G6 TRANSMITTER) MISC 1 Device by Does not apply route as directed. 07/27/19   Shamleffer, Melanie Crazier, MD  insulin aspart (NOVOLOG) 100 UNIT/ML injection USE UP TO 75 UNITS DAILY VIA INSULIN PUMP AS DIRECTED Patient taking differently: USE UP TO 70 UNITS DAILY VIA INSULIN PUMP AS DIRECTED 11/15/19   Vivi Barrack, MD  Insulin Infusion Pump (T:SLIM INSULIN PUMP) DEVI by Does not apply route.    [provider]  levothyroxine (SYNTHROID) 75 MCG tablet TAKE ONE TABLET BY MOUTH DAILY BEFORE BREAKFAST 02/06/21   Vivi Barrack, MD  lisinopril (ZESTRIL) 20 MG tablet Take 20 mg by  mouth at bedtime.    [provider]  meloxicam (MOBIC) 7.5 MG tablet Take 1 tablet (7.5 mg total) by mouth daily. 02/23/21   Vivi Barrack, MD  Multiple Vitamin (MULTIVITAMIN WITH MINERALS) TABS tablet Take 1 tablet by mouth daily.    [provider]  traMADol (ULTRAM) 50 MG tablet Take 50 mg by mouth every 6 (six) hours as needed. 10/16/21   [provider]     Family History  Problem Relation Age of Onset   Juvenile Diabetes Son    Juvenile Diabetes Grandchild    Prostate cancer Neg Hx    Tallo cancer Neg Hx     Social History   Socioeconomic History   Marital status: Married    Spouse name: Not on file   Number of children: Not on file   Years of education: 14   Highest education level: Not on file  Occupational History   Occupation: retired  Tobacco Use   Smoking status: Never   Smokeless tobacco: Never  Vaping Use   Vaping Use: Never used  Substance and Sexual Activity   Alcohol use: Not Currently   Drug use: Never   Sexual activity: Not on file  Other Topics Concern   Not on file  Social History Narrative   Right handed   One story home   Drinks caffeine   Social Determinants of Health   Financial Resource Strain: Not on file  Food Insecurity: Not  on file  Transportation Needs: Not on file  Physical Activity: Not on file  Stress: Not on file  Social Connections: Not on file    Review of Systems: A 12 point ROS discussed and pertinent positives are indicated in the HPI above.  All other systems are negative.  Review of Systems  Constitutional:  Negative for appetite change, chills and fever.  Respiratory:  Negative for shortness of breath.   Cardiovascular:  Negative for chest pain and leg swelling.  Gastrointestinal:  Negative for abdominal pain.  Genitourinary:  Positive for flank pain. Negative for dysuria and hematuria.  Neurological:  Negative for dizziness and headaches.    Vital Signs: There were no vitals taken for  this visit.    Physical Exam Vitals reviewed.  Constitutional:      General: He is not in acute distress.    Appearance: Normal appearance. He is not ill-appearing.  HENT:     Head: Normocephalic and atraumatic.     Mouth/Throat:     Mouth: Mucous membranes are dry.     Pharynx: Oropharynx is clear.  Eyes:     Extraocular Movements: Extraocular movements intact.     Pupils: Pupils are equal, round, and reactive to light.  Cardiovascular:     Rate and Rhythm: Normal rate and regular rhythm.     Pulses: Normal pulses.     Heart sounds: Normal heart sounds. No murmur heard. Pulmonary:     Effort: Pulmonary effort is normal. No respiratory distress.     Breath sounds: Normal breath sounds.  Abdominal:     General: Bowel sounds are normal. There is no distension.     Palpations: Abdomen is soft.     Tenderness: There is no abdominal tenderness. There is no guarding.  Musculoskeletal:     Right lower leg: No edema.     Left lower leg: No edema.  Skin:    General: Skin is warm and dry.  Neurological:     Mental Status: He is alert and oriented to person, place, and time.  Psychiatric:        Mood and Affect: Mood normal.        Behavior: Behavior normal.        Thought Content: Thought content normal.        Judgment: Judgment normal.     Imaging: No results found.  Labs:  CBC: Recent Labs    11/26/21 1408  WBC 7.4  HGB 13.6  HCT 41.5  PLT 275    COAGS: No results for input(s): "INR", "APTT" in the last 8760 hours.  BMP: Recent Labs    11/26/21 1408  NA 137  K 4.3  CL 103  CO2 29  GLUCOSE 132*  BUN 13  CALCIUM 9.2  CREATININE 1.01  GFRNONAA >60    LIVER FUNCTION TESTS: No results for input(s): "BILITOT", "AST", "ALT", "ALKPHOS", "PROT", "ALBUMIN" in the last 8760 hours.  TUMOR MARKERS: No results for input(s): "AFPTM", "CEA", "CA199", "CHROMGRNA" in the last 8760 hours.  Assessment and Plan: 71 yo male presents to IR today for left PCN  placement for OR intervention to treat left ureteral stone.  Pt resting on stretcher. He is A&O, calm and pleasant. He is in no distress.  Pt states he is NPO per order.  Last ASA was 12/05/21. Today's labs pending.   Risks and benefits of left PCN placement was discussed with the patient including, but not limited to, infection, bleeding, significant bleeding causing loss or  decrease in renal function or damage to adjacent structures.   All of the patient's questions were answered, patient is agreeable to proceed.  Consent signed and in chart.   Thank you for this interesting consult.  I greatly enjoyed meeting Dragan Decicco and look forward to participating in their care.  A copy of this report was sent to the requesting provider on this date.  Electronically Signed: Tyson Alias, NP 12/07/2021, 8:58 AM   I spent a total of 20 minutes in face to face in clinical consultation, greater than 50% of which was counseling/coordinating care for left renal calculus.

## 2021-12-05 ENCOUNTER — Other Ambulatory Visit: Payer: Self-pay | Admitting: Radiology

## 2021-12-05 ENCOUNTER — Other Ambulatory Visit: Payer: Self-pay | Admitting: Urology

## 2021-12-05 DIAGNOSIS — E109 Type 1 diabetes mellitus without complications: Secondary | ICD-10-CM | POA: Diagnosis not present

## 2021-12-05 DIAGNOSIS — N201 Calculus of ureter: Secondary | ICD-10-CM

## 2021-12-05 DIAGNOSIS — E039 Hypothyroidism, unspecified: Secondary | ICD-10-CM | POA: Diagnosis not present

## 2021-12-05 DIAGNOSIS — E785 Hyperlipidemia, unspecified: Secondary | ICD-10-CM | POA: Diagnosis not present

## 2021-12-06 DIAGNOSIS — E1065 Type 1 diabetes mellitus with hyperglycemia: Secondary | ICD-10-CM | POA: Diagnosis not present

## 2021-12-06 NOTE — H&P (Signed)
Pt presents today for pre-operative history and physical exam in anticipation of 1st stage left PCNL by Dr. Jeffie Pollock on 12/07/21. He is nervous but doing well and without complaint.   Pt denies F/C, HA, CP, SOB, N/V, diarrhea/constipation, back pain, flank pain, hematuria, and dysuria.     HX:     CC: I have kidney stones.  HPI: Rodney Miller is a 71 year-old male established patient who is here for renal calculi.  Rodney Miller is a 71 yo male who had a stone 20 years ago and had ESWL. He is sent now for left flank pain and a 28 x 19m stone in the left kidney. He has had no hematuria. He has no nausea. The is intermittent and can be worse with sitting and turning. He has mild LUTS with an IPSS of 6. He has had no UTI or other GU surgery.      ALLERGIES: None   MEDICATIONS: Lisinopril 10 mg tablet  Ascorbic Acid 500 mg tablet  Atorvastatin Calcium 20 mg tablet  Desonide 0.05 % cream  Inpen (For Novolog Or Fiasp)  Levothyroxine Sodium 75 mcg capsule  Meloxicam 7.5 mg tablet  Tramadol Hcl 50 mg tablet 1 tablet PO Q 6 H PRN  Vitamin D3     Notes: Insulin pump continuous    GU PSH: None     PSH Notes: Kidney stone surgery, leg surgery   NON-GU PSH: None   GU PMH: Renal calculus, He has a 2.8cm non-obstructing LLP stone with intermittitent pain. I discussed treatment options and have recommended a PCNL which will be scheduled in the near future. I reviewed the risks of bleeding, infection, injury to the kidney and adjacent structures, renal loss, need for secondary procedures, urine leaks, thrombotic events and anesthetic complications. - 08/21/2021      PMH Notes: Hypothyroidism   NON-GU PMH: Hypercholesterolemia Hypertension Type 1 diabetes mellitus without complications    FAMILY HISTORY: 2 daughters - Other 1 son - Other Death In The Family Father - Other Diabetes - Runs in Family, Son   SOCIAL HISTORY: Marital Status: Married Preferred Language: English; Ethnicity: Not Hispanic Or  Latino; Race: White Current Smoking Status: Patient has never smoked.   Tobacco Use Assessment Completed: Used Tobacco in last 30 days? Does not use smokeless tobacco. Has never drank.  Does not use drugs. Drinks 2 caffeinated drinks per day. Has not had a blood transfusion. 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  Gastrointestinal (Upper):   Patient denies nausea, vomiting, and indigestion/ heartburn.  Gastrointestinal (Lower):   Patient denies diarrhea and constipation.  Constitutional:   Patient denies fever, night sweats, weight loss, and fatigue.  Skin:   Patient denies skin rash/ lesion and itching.  Eyes:   Patient denies blurred vision and double vision.  Ears/ Nose/ Throat:   Patient denies sore throat and sinus problems.  Hematologic/Lymphatic:   Patient denies swollen glands and easy bruising.  Cardiovascular:   Patient denies leg swelling and chest pains.  Respiratory:   Patient denies cough and shortness of breath.  Endocrine:   Patient denies excessive thirst.  Musculoskeletal:   Patient denies back pain and joint pain.  Neurological:  Patient denies headaches and dizziness.  Psychologic:   Patient denies depression and anxiety.   VITAL SIGNS:      11/20/2021 03:43 PM  BP 151/77 mmHg  Pulse 76 /min  Temperature 97.3 F / 36.2 C   MULTI-SYSTEM PHYSICAL EXAMINATION:    Constitutional: Well-nourished. No physical deformities. Normally developed. Good grooming.  Neck: Neck symmetrical, not swollen. Normal tracheal position.  Respiratory: Normal breath sounds. No labored breathing, no use of accessory muscles.   Cardiovascular: Regular rate and rhythm. No  murmur, no gallop.   Lymphatic: No enlargement of neck, axillae, groin.  Skin: No paleness, no jaundice, no cyanosis. No lesion, no ulcer, no rash.  Neurologic / Psychiatric: Oriented to time, oriented to place, oriented to person. No depression, no anxiety, no agitation.  Gastrointestinal: No mass, no tenderness, no rigidity, non obese abdomen.  Eyes: Normal conjunctivae. Normal eyelids.  Ears, Nose, Mouth, and Throat: Left ear no scars, no lesions, no masses. Right ear no scars, no lesions, no masses. Nose no scars, no lesions, no masses. Normal hearing. Normal lips.  Musculoskeletal: Normal gait and station of head and neck.     Complexity of Data:  Records Review:   Previous Patient Records  Urine Test Review:   Urinalysis   11/20/21  Urinalysis  Urine Appearance Clear   Urine Color Yellow   Urine Glucose 2+ mg/dL  Urine Bilirubin Neg mg/dL  Urine Ketones Neg mg/dL  Urine Specific Gravity 1.015   Urine Blood Neg ery/uL  Urine pH 6.5   Urine Protein Neg mg/dL  Urine Urobilinogen 0.2 mg/dL  Urine Nitrites Neg   Urine Leukocyte Esterase Neg leu/uL   PROCEDURES:          Urinalysis - 81003 Dipstick Dipstick Cont'd  Color: Yellow Bilirubin: Neg mg/dL  Appearance: Clear Ketones: Neg mg/dL  Specific Gravity: 1.015 Blood: Neg ery/uL  pH: 6.5 Protein: Neg mg/dL  Glucose: 2+ mg/dL Urobilinogen: 0.2 mg/dL    Nitrites: Neg    Leukocyte Esterase: Neg leu/uL    ASSESSMENT:      ICD-10 Details  1 GU:   Renal calculus - N20.0    PLAN:           Schedule Return Visit/Planned Activity: Keep Scheduled Appointment - Schedule Surgery          Document Letter(s):  Created for Patient: Clinical Summary         Notes:   There are no changes in the patients history or physical exam since last evaluation by Dr. Jeffie Pollock. Pt is scheduled to undergo 1st stage left PCNL on 12/07/21.   All pt's questions were answered to the best of my ability.          Next Appointment:      Next  Appointment: 12/07/2021 11:00 AM    Appointment Type: Surgery     Location: [facility] [phone]    Provider: Irine Seal, M.D.    Reason for Visit: OBS WL LT PCNL

## 2021-12-07 ENCOUNTER — Other Ambulatory Visit: Payer: Self-pay

## 2021-12-07 ENCOUNTER — Encounter (HOSPITAL_COMMUNITY): Payer: Self-pay | Admitting: Urology

## 2021-12-07 ENCOUNTER — Observation Stay (HOSPITAL_COMMUNITY): Payer: Medicare HMO

## 2021-12-07 ENCOUNTER — Ambulatory Visit (HOSPITAL_COMMUNITY)
Admission: RE | Admit: 2021-12-07 | Discharge: 2021-12-07 | Disposition: A | Discharge status: Home / Self Care | Payer: Medicare HMO | Source: Ambulatory Visit | Attending: Urology | Admitting: Urology

## 2021-12-07 ENCOUNTER — Ambulatory Visit (HOSPITAL_COMMUNITY): Payer: Medicare HMO | Admitting: Anesthesiology

## 2021-12-07 ENCOUNTER — Inpatient Hospital Stay (HOSPITAL_COMMUNITY)
Admission: AD | Admit: 2021-12-07 | Discharge: 2021-12-10 | DRG: 660 | Disposition: A | Discharge status: Home / Self Care | Payer: Medicare HMO | Source: Ambulatory Visit | Attending: Urology | Admitting: Urology

## 2021-12-07 ENCOUNTER — Encounter (HOSPITAL_COMMUNITY): Payer: Self-pay

## 2021-12-07 ENCOUNTER — Ambulatory Visit (HOSPITAL_COMMUNITY): Payer: Medicare HMO | Admitting: Physician Assistant

## 2021-12-07 ENCOUNTER — Encounter (HOSPITAL_COMMUNITY)
Admission: AD | Disposition: A | Discharge status: Home / Self Care | Payer: Self-pay | Source: Ambulatory Visit | Attending: Urology

## 2021-12-07 DIAGNOSIS — E1065 Type 1 diabetes mellitus with hyperglycemia: Secondary | ICD-10-CM

## 2021-12-07 DIAGNOSIS — Z791 Long term (current) use of non-steroidal anti-inflammatories (NSAID): Secondary | ICD-10-CM

## 2021-12-07 DIAGNOSIS — Z87442 Personal history of urinary calculi: Secondary | ICD-10-CM

## 2021-12-07 DIAGNOSIS — N200 Calculus of kidney: Principal | ICD-10-CM

## 2021-12-07 DIAGNOSIS — N201 Calculus of ureter: Secondary | ICD-10-CM

## 2021-12-07 DIAGNOSIS — E119 Type 2 diabetes mellitus without complications: Secondary | ICD-10-CM | POA: Diagnosis present

## 2021-12-07 DIAGNOSIS — I10 Essential (primary) hypertension: Secondary | ICD-10-CM | POA: Diagnosis present

## 2021-12-07 DIAGNOSIS — Z833 Family history of diabetes mellitus: Secondary | ICD-10-CM

## 2021-12-07 DIAGNOSIS — Z0389 Encounter for observation for other suspected diseases and conditions ruled out: Secondary | ICD-10-CM | POA: Diagnosis not present

## 2021-12-07 DIAGNOSIS — D62 Acute posthemorrhagic anemia: Secondary | ICD-10-CM | POA: Diagnosis not present

## 2021-12-07 DIAGNOSIS — E039 Hypothyroidism, unspecified: Secondary | ICD-10-CM

## 2021-12-07 DIAGNOSIS — Z79899 Other long term (current) drug therapy: Secondary | ICD-10-CM

## 2021-12-07 DIAGNOSIS — Z794 Long term (current) use of insulin: Secondary | ICD-10-CM

## 2021-12-07 DIAGNOSIS — N202 Calculus of kidney with calculus of ureter: Principal | ICD-10-CM | POA: Diagnosis present

## 2021-12-07 DIAGNOSIS — Z79890 Hormone replacement therapy (postmenopausal): Secondary | ICD-10-CM

## 2021-12-07 DIAGNOSIS — Z9641 Presence of insulin pump (external) (internal): Secondary | ICD-10-CM | POA: Diagnosis present

## 2021-12-07 HISTORY — PX: IR URETERAL STENT LEFT NEW ACCESS W/O SEP NEPHROSTOMY CATH: IMG6075

## 2021-12-07 HISTORY — PX: NEPHROLITHOTOMY: SHX5134

## 2021-12-07 LAB — HEMOGLOBIN AND HEMATOCRIT, BLOOD
HCT: 33.3 % — ABNORMAL LOW (ref 39.0–52.0)
HCT: 34.5 % — ABNORMAL LOW (ref 39.0–52.0)
Hemoglobin: 10.8 g/dL — ABNORMAL LOW (ref 13.0–17.0)
Hemoglobin: 11 g/dL — ABNORMAL LOW (ref 13.0–17.0)

## 2021-12-07 LAB — CBC WITH DIFFERENTIAL/PLATELET
Abs Immature Granulocytes: 0.04 10*3/uL (ref 0.00–0.07)
Basophils Absolute: 0.1 10*3/uL (ref 0.0–0.1)
Basophils Relative: 1 %
Eosinophils Absolute: 0.1 10*3/uL (ref 0.0–0.5)
Eosinophils Relative: 2 %
HCT: 39.7 % (ref 39.0–52.0)
Hemoglobin: 13.2 g/dL (ref 13.0–17.0)
Immature Granulocytes: 1 %
Lymphocytes Relative: 26 %
Lymphs Abs: 1.8 10*3/uL (ref 0.7–4.0)
MCH: 33.1 pg (ref 26.0–34.0)
MCHC: 33.2 g/dL (ref 30.0–36.0)
MCV: 99.5 fL (ref 80.0–100.0)
Monocytes Absolute: 0.7 10*3/uL (ref 0.1–1.0)
Monocytes Relative: 11 %
Neutro Abs: 4.1 10*3/uL (ref 1.7–7.7)
Neutrophils Relative %: 59 %
Platelets: 266 10*3/uL (ref 150–400)
RBC: 3.99 MIL/uL — ABNORMAL LOW (ref 4.22–5.81)
RDW: 12.8 % (ref 11.5–15.5)
WBC: 6.8 10*3/uL (ref 4.0–10.5)
nRBC: 0 % (ref 0.0–0.2)

## 2021-12-07 LAB — BASIC METABOLIC PANEL
Anion gap: 5 (ref 5–15)
BUN: 17 mg/dL (ref 8–23)
CO2: 23 mmol/L (ref 22–32)
Calcium: 8.9 mg/dL (ref 8.9–10.3)
Chloride: 110 mmol/L (ref 98–111)
Creatinine, Ser: 1.01 mg/dL (ref 0.61–1.24)
GFR, Estimated: >60 mL/min (ref >60)
Glucose, Bld: 138 mg/dL — ABNORMAL HIGH (ref 70–99)
Potassium: 4.8 mmol/L (ref 3.5–5.1)
Sodium: 138 mmol/L (ref 135–145)

## 2021-12-07 LAB — ABO/RH: ABO/RH(D): O POS

## 2021-12-07 LAB — GLUCOSE, CAPILLARY
Glucose-Capillary: 132 mg/dL — ABNORMAL HIGH (ref 70–99)
Glucose-Capillary: 149 mg/dL — ABNORMAL HIGH (ref 70–99)

## 2021-12-07 LAB — PROTIME-INR
INR: 1 (ref 0.8–1.2)
Prothrombin Time: 13.2 seconds (ref 11.4–15.2)

## 2021-12-07 SURGERY — NEPHROLITHOTOMY PERCUTANEOUS
Anesthesia: General | Site: Flank | Laterality: Left

## 2021-12-07 MED ORDER — LISINOPRIL 20 MG PO TABS
20.0000 mg | ORAL_TABLET | Freq: Every day | ORAL | Status: DC
  Administered 2021-12-07 – 2021-12-09 (×3): 20 mg via ORAL
  Filled 2021-12-07 (×3): qty 1

## 2021-12-07 MED ORDER — DEXAMETHASONE SODIUM PHOSPHATE 10 MG/ML IJ SOLN
INTRAMUSCULAR | Status: AC
  Filled 2021-12-07: qty 1

## 2021-12-07 MED ORDER — CEFAZOLIN SODIUM-DEXTROSE 2-4 GM/100ML-% IV SOLN
2.0000 g | INTRAVENOUS | Status: AC
  Administered 2021-12-07: 2 g via INTRAVENOUS
  Filled 2021-12-07: qty 100

## 2021-12-07 MED ORDER — DEXCOM G6 SENSOR MISC: 1.0000 | Status: DC

## 2021-12-07 MED ORDER — FENTANYL CITRATE PF 50 MCG/ML IJ SOSY: 25.0000 ug | PREFILLED_SYRINGE | INTRAMUSCULAR | Status: DC | PRN

## 2021-12-07 MED ORDER — DEXCOM G6 TRANSMITTER MISC: 1.0000 | Status: DC

## 2021-12-07 MED ORDER — MIDAZOLAM HCL 2 MG/2ML IJ SOLN
INTRAMUSCULAR | Status: AC | PRN
  Administered 2021-12-07: 1 mg via INTRAVENOUS

## 2021-12-07 MED ORDER — PROPOFOL 10 MG/ML IV BOLUS
INTRAVENOUS | Status: AC
  Filled 2021-12-07: qty 20

## 2021-12-07 MED ORDER — SODIUM CHLORIDE 0.9 % IV SOLN: INTRAVENOUS | Status: DC

## 2021-12-07 MED ORDER — AMISULPRIDE (ANTIEMETIC) 5 MG/2ML IV SOLN: 10.0000 mg | Freq: Once | INTRAVENOUS | Status: DC | PRN

## 2021-12-07 MED ORDER — SENNOSIDES-DOCUSATE SODIUM 8.6-50 MG PO TABS: 1.0000 | ORAL_TABLET | Freq: Every evening | ORAL | Status: DC | PRN

## 2021-12-07 MED ORDER — 0.9 % SODIUM CHLORIDE (POUR BTL) OPTIME
TOPICAL | Status: DC | PRN
  Administered 2021-12-07: 1000 mL

## 2021-12-07 MED ORDER — FENTANYL CITRATE (PF) 100 MCG/2ML IJ SOLN
INTRAMUSCULAR | Status: AC | PRN
  Administered 2021-12-07: 50 ug via INTRAVENOUS

## 2021-12-07 MED ORDER — EPHEDRINE SULFATE-NACL 50-0.9 MG/10ML-% IV SOSY
PREFILLED_SYRINGE | INTRAVENOUS | Status: DC | PRN
  Administered 2021-12-07 (×5): 5 mg via INTRAVENOUS

## 2021-12-07 MED ORDER — LIDOCAINE HCL 1 % IJ SOLN
INTRAMUSCULAR | Status: AC | PRN
  Administered 2021-12-07: 10 mL via INTRADERMAL

## 2021-12-07 MED ORDER — FENTANYL CITRATE (PF) 100 MCG/2ML IJ SOLN
INTRAMUSCULAR | Status: AC
  Filled 2021-12-07: qty 2

## 2021-12-07 MED ORDER — MIDAZOLAM HCL 2 MG/2ML IJ SOLN
INTRAMUSCULAR | Status: AC
  Filled 2021-12-07: qty 2

## 2021-12-07 MED ORDER — INSULIN PUMP
Freq: Three times a day (TID) | SUBCUTANEOUS | Status: DC
  Filled 2021-12-07: qty 1

## 2021-12-07 MED ORDER — LEVOTHYROXINE SODIUM 75 MCG PO TABS
75.0000 ug | ORAL_TABLET | Freq: Every day | ORAL | Status: DC
  Administered 2021-12-08 – 2021-12-10 (×3): 75 ug via ORAL
  Filled 2021-12-07 (×3): qty 1

## 2021-12-07 MED ORDER — OXYCODONE HCL 5 MG PO TABS
5.0000 mg | ORAL_TABLET | ORAL | Status: DC | PRN
  Administered 2021-12-07 – 2021-12-08 (×2): 5 mg via ORAL
  Filled 2021-12-07 (×2): qty 1

## 2021-12-07 MED ORDER — PROMETHAZINE HCL 25 MG/ML IJ SOLN: 6.2500 mg | INTRAMUSCULAR | Status: DC | PRN

## 2021-12-07 MED ORDER — IOHEXOL 300 MG/ML  SOLN
INTRAMUSCULAR | Status: DC | PRN
  Administered 2021-12-07: 18 mL

## 2021-12-07 MED ORDER — BISACODYL 10 MG RE SUPP: 10.0000 mg | Freq: Every day | RECTAL | Status: DC | PRN

## 2021-12-07 MED ORDER — POTASSIUM CHLORIDE IN NACL 20-0.45 MEQ/L-% IV SOLN
INTRAVENOUS | Status: DC
  Filled 2021-12-07 (×6): qty 1000

## 2021-12-07 MED ORDER — LIDOCAINE HCL 1 % IJ SOLN
INTRAMUSCULAR | Status: AC
  Filled 2021-12-07: qty 20

## 2021-12-07 MED ORDER — CHLORHEXIDINE GLUCONATE 0.12 % MT SOLN
15.0000 mL | Freq: Once | OROMUCOSAL | Status: AC
  Administered 2021-12-07: 15 mL via OROMUCOSAL

## 2021-12-07 MED ORDER — PROPOFOL 10 MG/ML IV BOLUS
INTRAVENOUS | Status: DC | PRN
  Administered 2021-12-07: 160 mg via INTRAVENOUS

## 2021-12-07 MED ORDER — SUGAMMADEX SODIUM 200 MG/2ML IV SOLN
INTRAVENOUS | Status: DC | PRN
  Administered 2021-12-07: 200 mg via INTRAVENOUS

## 2021-12-07 MED ORDER — ACETAMINOPHEN 325 MG PO TABS
650.0000 mg | ORAL_TABLET | ORAL | Status: DC | PRN
  Administered 2021-12-07: 650 mg via ORAL
  Filled 2021-12-07: qty 2

## 2021-12-07 MED ORDER — ROCURONIUM BROMIDE 10 MG/ML (PF) SYRINGE
PREFILLED_SYRINGE | INTRAVENOUS | Status: AC
  Filled 2021-12-07: qty 10

## 2021-12-07 MED ORDER — TRAMADOL HCL 50 MG PO TABS
50.0000 mg | ORAL_TABLET | Freq: Four times a day (QID) | ORAL | Status: DC | PRN
  Administered 2021-12-08 – 2021-12-09 (×2): 50 mg via ORAL
  Filled 2021-12-07 (×2): qty 1

## 2021-12-07 MED ORDER — DEXAMETHASONE SODIUM PHOSPHATE 10 MG/ML IJ SOLN
INTRAMUSCULAR | Status: DC | PRN
  Administered 2021-12-07: 10 mg via INTRAVENOUS

## 2021-12-07 MED ORDER — LIDOCAINE HCL (PF) 2 % IJ SOLN
INTRAMUSCULAR | Status: AC
  Filled 2021-12-07: qty 5

## 2021-12-07 MED ORDER — ROCURONIUM BROMIDE 10 MG/ML (PF) SYRINGE
PREFILLED_SYRINGE | INTRAVENOUS | Status: DC | PRN
  Administered 2021-12-07: 80 mg via INTRAVENOUS

## 2021-12-07 MED ORDER — SODIUM CHLORIDE 0.9 % IR SOLN
Status: DC | PRN
  Administered 2021-12-07: 27000 mL

## 2021-12-07 MED ORDER — SODIUM CHLORIDE 0.9 % IV SOLN
2.0000 g | Freq: Once | INTRAVENOUS | Status: AC
  Administered 2021-12-07: 2 g via INTRAVENOUS
  Filled 2021-12-07: qty 20

## 2021-12-07 MED ORDER — INSULIN ASPART 100 UNIT/ML IJ SOLN
0.0000 [IU] | Freq: Three times a day (TID) | INTRAMUSCULAR | Status: DC
  Administered 2021-12-10: 2.88 [IU] via SUBCUTANEOUS

## 2021-12-07 MED ORDER — LACTATED RINGERS IV SOLN: INTRAVENOUS | Status: DC

## 2021-12-07 MED ORDER — CELECOXIB 200 MG PO CAPS
200.0000 mg | ORAL_CAPSULE | Freq: Once | ORAL | Status: DC
  Filled 2021-12-07: qty 1

## 2021-12-07 MED ORDER — HYDROMORPHONE HCL 1 MG/ML IJ SOLN
0.5000 mg | INTRAMUSCULAR | Status: DC | PRN
  Administered 2021-12-07 – 2021-12-08 (×3): 1 mg via INTRAVENOUS
  Filled 2021-12-07 (×3): qty 1

## 2021-12-07 MED ORDER — ATORVASTATIN CALCIUM 20 MG PO TABS
20.0000 mg | ORAL_TABLET | Freq: Every day | ORAL | Status: DC
  Administered 2021-12-08 – 2021-12-10 (×3): 20 mg via ORAL
  Filled 2021-12-07 (×3): qty 1

## 2021-12-07 MED ORDER — ONDANSETRON HCL 4 MG/2ML IJ SOLN
INTRAMUSCULAR | Status: DC | PRN
  Administered 2021-12-07: 4 mg via INTRAVENOUS

## 2021-12-07 MED ORDER — PHENYLEPHRINE 80 MCG/ML (10ML) SYRINGE FOR IV PUSH (FOR BLOOD PRESSURE SUPPORT)
PREFILLED_SYRINGE | INTRAVENOUS | Status: DC | PRN
  Administered 2021-12-07 (×2): 160 ug via INTRAVENOUS

## 2021-12-07 MED ORDER — CEFAZOLIN SODIUM-DEXTROSE 1-4 GM/50ML-% IV SOLN
1.0000 g | Freq: Three times a day (TID) | INTRAVENOUS | Status: DC
  Administered 2021-12-07 – 2021-12-10 (×8): 1 g via INTRAVENOUS
  Filled 2021-12-07 (×9): qty 50

## 2021-12-07 MED ORDER — ORAL CARE MOUTH RINSE: 15.0000 mL | Freq: Once | OROMUCOSAL | Status: AC

## 2021-12-07 MED ORDER — ACETAMINOPHEN 500 MG PO TABS
1000.0000 mg | ORAL_TABLET | Freq: Once | ORAL | Status: DC
  Filled 2021-12-07: qty 2

## 2021-12-07 MED ORDER — ONDANSETRON HCL 4 MG/2ML IJ SOLN: 4.0000 mg | INTRAMUSCULAR | Status: DC | PRN

## 2021-12-07 MED ORDER — IOHEXOL 300 MG/ML  SOLN
50.0000 mL | Freq: Once | INTRAMUSCULAR | Status: AC | PRN
  Administered 2021-12-07: 5 mL

## 2021-12-07 MED ORDER — ONDANSETRON HCL 4 MG/2ML IJ SOLN
INTRAMUSCULAR | Status: AC
  Filled 2021-12-07: qty 2

## 2021-12-07 MED ORDER — CITALOPRAM HYDROBROMIDE 20 MG PO TABS
20.0000 mg | ORAL_TABLET | Freq: Every day | ORAL | Status: DC
  Administered 2021-12-08 – 2021-12-10 (×3): 20 mg via ORAL
  Filled 2021-12-07 (×3): qty 1

## 2021-12-07 MED ORDER — FENTANYL CITRATE (PF) 100 MCG/2ML IJ SOLN
INTRAMUSCULAR | Status: DC | PRN
  Administered 2021-12-07: 100 ug via INTRAVENOUS
  Administered 2021-12-07: 50 ug via INTRAVENOUS

## 2021-12-07 MED ORDER — FLEET ENEMA 7-19 GM/118ML RE ENEM: 1.0000 | ENEMA | Freq: Once | RECTAL | Status: DC | PRN

## 2021-12-07 MED ORDER — LIDOCAINE 2% (20 MG/ML) 5 ML SYRINGE
INTRAMUSCULAR | Status: DC | PRN
  Administered 2021-12-07: 40 mg via INTRAVENOUS

## 2021-12-07 SURGICAL SUPPLY — 51 items
BAG COUNTER SPONGE SURGICOUNT (BAG) IMPLANT
BAG URINE DRAIN 2000ML AR STRL (UROLOGICAL SUPPLIES) ×1 IMPLANT
BASKET ZERO TIP NITINOL 2.4FR (BASKET) IMPLANT
BENZOIN TINCTURE PRP APPL 2/3 (GAUZE/BANDAGES/DRESSINGS) ×1 IMPLANT
BLADE SURG 15 STRL LF DISP TIS (BLADE) ×1 IMPLANT
BLADE SURG 15 STRL SS (BLADE) ×1
CATH 2WAY 30CC 24FR (CATHETERS) IMPLANT
CATH COUNCIL 22FR (CATHETERS) IMPLANT
CATH ROBINSON RED A/P 20FR (CATHETERS) IMPLANT
CATH URETERAL DUAL LUMEN 10F (MISCELLANEOUS) ×1 IMPLANT
CATH URETL OPEN 5X70 (CATHETERS) IMPLANT
CATH UROLOGY TORQUE 40 (MISCELLANEOUS) ×1 IMPLANT
CATH X-FORCE N30 NEPHROSTOMY (TUBING) ×1 IMPLANT
CHLORAPREP W/TINT 26 (MISCELLANEOUS) ×1 IMPLANT
COVER SURGICAL LIGHT HANDLE (MISCELLANEOUS) IMPLANT
DRAPE C-ARM 42X120 X-RAY (DRAPES) ×1 IMPLANT
DRAPE LINGEMAN PERC (DRAPES) ×1 IMPLANT
DRAPE SURG IRRIG POUCH 19X23 (DRAPES) ×1 IMPLANT
DRSG TEGADERM 8X12 (GAUZE/BANDAGES/DRESSINGS) ×2 IMPLANT
EXTRACTOR STONE NITINOL NGAGE (UROLOGICAL SUPPLIES) IMPLANT
GAUZE PAD ABD 8X10 STRL (GAUZE/BANDAGES/DRESSINGS) ×2 IMPLANT
GAUZE SPONGE 4X4 12PLY STRL (GAUZE/BANDAGES/DRESSINGS) ×1 IMPLANT
GLOVE SURG SS PI 8.0 STRL IVOR (GLOVE) ×1 IMPLANT
GOWN STRL REUS W/ TWL XL LVL3 (GOWN DISPOSABLE) ×1 IMPLANT
GOWN STRL REUS W/TWL XL LVL3 (GOWN DISPOSABLE) ×1
GUIDEWIRE AMPLAZ .035X145 (WIRE) ×2 IMPLANT
GUIDEWIRE STR DUAL SENSOR (WIRE) IMPLANT
KIT BASIN OR (CUSTOM PROCEDURE TRAY) ×1 IMPLANT
KIT PROBE 340X3.4XDISP GRN (MISCELLANEOUS) IMPLANT
KIT PROBE TRILOGY 3.4X340 (MISCELLANEOUS)
KIT PROBE TRILOGY 3.9X350 (MISCELLANEOUS) IMPLANT
KIT TURNOVER KIT A (KITS) IMPLANT
LASER FIB FLEXIVA PULSE ID 365 (Laser) IMPLANT
MANIFOLD NEPTUNE II (INSTRUMENTS) ×1 IMPLANT
NS IRRIG 1000ML POUR BTL (IV SOLUTION) ×1 IMPLANT
PACK CYSTO (CUSTOM PROCEDURE TRAY) ×1 IMPLANT
SHEATH PEELAWAY SET 9 (SHEATH) IMPLANT
SPONGE T-LAP 4X18 ~~LOC~~+RFID (SPONGE) ×1 IMPLANT
SUT SILK 2 0 30  PSL (SUTURE) ×1
SUT SILK 2 0 30 PSL (SUTURE) ×1 IMPLANT
SUT SILK 2 0 SH (SUTURE) IMPLANT
SYR 10ML LL (SYRINGE) ×1 IMPLANT
SYR 20ML LL LF (SYRINGE) ×2 IMPLANT
TOWEL OR 17X26 10 PK STRL BLUE (TOWEL DISPOSABLE) ×1 IMPLANT
TOWEL OR NON WOVEN STRL DISP B (DISPOSABLE) ×1 IMPLANT
TRACTIP FLEXIVA PULS ID 200XHI (Laser) IMPLANT
TRACTIP FLEXIVA PULSE ID 200 (Laser) ×1
TRAY FOLEY MTR SLVR 16FR STAT (SET/KITS/TRAYS/PACK) ×1 IMPLANT
TUBING CONNECTING 10 (TUBING) ×2 IMPLANT
TUBING STONE CATCHER TRILOGY (MISCELLANEOUS) IMPLANT
TUBING UROLOGY SET (TUBING) ×1 IMPLANT

## 2021-12-07 NOTE — Inpatient Diabetes Management (Signed)
Inpatient Diabetes Program Recommendations  AACE/ADA: New Consensus Statement on Inpatient Glycemic Control (2015)  Target Ranges:  Prepandial:   less than 140 mg/dL      Peak postprandial:   less than 180 mg/dL (1-2 hours)      Critically ill patients:  140 - 180 mg/dL   Lab Results  Component Value Date   GLUCAP 149 (H) 12/07/2021   HGBA1C 7.5 (H) 11/26/2021    Review of Glycemic Control  Diabetes history: DM1 Outpatient Diabetes medications: Insulin pump Tandum T-slim, Dexcom G-6 Current orders for Inpatient glycemic control: Insulin pump order set  Spoke with pt at bedside regarding insulin pump policy, contract and recording boluses for RN to chart.  Basal rate: 0.6/H Bolus: 1:15 CHO ratio CF - 50  Inpatient Diabetes Program Recommendations:    Spoke with pt at bedside regarding insulin pump. See settings.   Will follow.   Thank you. Lorenda Peck, RD, LDN, [place] Inpatient Diabetes Coordinator [phone]

## 2021-12-07 NOTE — Anesthesia Procedure Notes (Signed)
Procedure Name: Intubation Date/Time: 12/07/2021 11:50 AM  Performed by: Gean Maidens, CRNAPre-anesthesia Checklist: Patient identified, Emergency Drugs available, Suction available, Patient being monitored and Timeout performed Patient Re-evaluated:Patient Re-evaluated prior to induction Oxygen Delivery Method: Circle system utilized Preoxygenation: Pre-oxygenation with 100% oxygen Induction Type: IV induction Ventilation: Mask ventilation without difficulty Laryngoscope Size: Mac and 4 Grade View: Grade I Tube type: Oral Tube size: 7.5 mm Number of attempts: 1 Airway Equipment and Method: Stylet Placement Confirmation: ETT inserted through vocal cords under direct vision, positive ETCO2 and breath sounds checked- equal and bilateral Secured at: 23 cm Tube secured with: Tape Dental Injury: Teeth and Oropharynx as per pre-operative assessment

## 2021-12-07 NOTE — Progress Notes (Signed)
He is doing well post op with an expected level of pain.  The urine in the foley and NT are dark cherry red without clots.    His post op Hgb was 10.8.   BP 124/60 (BP Location: Left Arm)   Pulse 79   Temp 97.6 F (36.4 C) (Oral)   Resp 14   SpO2 100%    I will recheck the Hgb at 8pm and in the am.

## 2021-12-07 NOTE — Anesthesia Preprocedure Evaluation (Addendum)
Anesthesia Evaluation  Patient identified by MRN, date of birth, ID band Patient awake    Reviewed: Allergy & Precautions, NPO status , Patient's Chart, lab work & pertinent test results  History of Anesthesia Complications Negative for: history of anesthetic complications  Airway Mallampati: II  TM Distance: >3 FB Neck ROM: Full    Dental no notable dental hx. (+) Dental Advisory Given   Pulmonary neg pulmonary ROS,    Pulmonary exam normal        Cardiovascular hypertension, Pt. on medications Normal cardiovascular exam     Neuro/Psych negative neurological ROS     GI/Hepatic negative GI ROS, Neg liver ROS,   Endo/Other  diabetesHypothyroidism   Renal/GU      Musculoskeletal negative musculoskeletal ROS (+)   Abdominal   Peds  Hematology negative hematology ROS (+)   Anesthesia Other Findings   Reproductive/Obstetrics                            Anesthesia Physical Anesthesia Plan  ASA: 3  Anesthesia Plan: General   Post-op Pain Management: Celebrex PO (pre-op)* and Tylenol PO (pre-op)*   Induction: Intravenous  PONV Risk Score and Plan: 2 and Ondansetron and Dexamethasone  Airway Management Planned: LMA  Additional Equipment: None  Intra-op Plan:   Post-operative Plan: Extubation in OR  Informed Consent: I have reviewed the patients History and Physical, chart, labs and discussed the procedure including the risks, benefits and alternatives for the proposed anesthesia with the patient or authorized representative who has indicated his/her understanding and acceptance.     Dental advisory given  Plan Discussed with: Anesthesiologist and CRNA  Anesthesia Plan Comments:        Anesthesia Quick Evaluation

## 2021-12-07 NOTE — Transfer of Care (Signed)
Immediate Anesthesia Transfer of Care Note  Patient: Marvell Jasperson  Procedure(s) Performed: FIRST STAGE LEFT NEPHROLITHOTOMY PERCUTANEOUS (Left: Flank)  Patient Location: PACU  Anesthesia Type:General  Level of Consciousness: awake, alert  and oriented  Airway & Oxygen Therapy: Patient Spontanous Breathing and Patient connected to face mask oxygen  Post-op Assessment: Report given to RN and Post -op Vital signs reviewed and stable  Post vital signs: Reviewed and stable  Last Vitals:  Vitals Value Taken Time  BP 109/61 12/07/21 1424  Temp    Pulse 75 12/07/21 1425  Resp 14 12/07/21 1425  SpO2 99 % 12/07/21 1425  Vitals shown include unvalidated device data.  Last Pain:  Vitals:   12/07/21 0824  TempSrc: Oral  PainSc: 2       Patients Stated Pain Goal: 0 (55/37/48 2707)  Complications: No notable events documented.

## 2021-12-07 NOTE — Op Note (Signed)
Procedure: 1.  First stage left percutaneous nephrolithotomy for greater than 2 cm stone. 2.  Dilation of existing tract 3.  Antegrade nephrostogram through existing tract.  4..  Application of fluoroscopy.  Preop diagnosis: 2.7 cm left upper pole renal stone.  Postop diagnosis: Same.  Surgeon: Dr. Irine Seal.  Anesthesia: General.  Specimen: Stone fragments.  Drain: 1.  Foley catheter. 2.  73 [place] [employer] Foley left nephrostomy catheter. 3.  5 French nephro view catheter.  EBL: 500 mL.  Complications: None.  Indications: The patient is a 71 year old male who has a 2.7 mm left upper pole stone he is elected percutaneous nephrolithotomy for management.  He underwent placement of an access catheter earlier today in interventional radiology.  Procedure: He was taken operating room was given Ancef.  A general anesthetic was induced on the holding room stretcher.  A Foley catheter was placed using sterile technique and was left to straight drainage.  He was then rolled prone onto the operating table on chest rolls with care taken to pad all pressure points.  PAS hose were then placed.  His previous access site was then prepped with ChloraPrep and after appropriate drying time he was draped in usual sterile fashion.  A wire was passed through the access catheter under fluoroscopic guidance to the wire and the access catheter was removed.  The skin incision was enlarged to 2 cm with a knife.  The 10 French dual-lumen catheter was then passed over the working wire into the mid ureter and a second Amplatz Super Stiff wire was then passed into the bladder.  The dual-lumen catheter was removed and the NephroMax balloon was placed over the wire into the renal pelvis and inflated to 20 atm.  The access sheath was then advanced over the balloon into the renal pelvis.  The balloon was initially inflated but there was moderately significant bleeding so the balloon was reinflated and left up for 3 minutes  before being deflated and less bleeding was noted.  The rigid nephroscope was then used to inspect the collecting system.  Initially I was unable to identify the stone as it was then anterior calyx that was a somewhat acute angle from the access sheath.  I then used a flexible scope to more clearly identify the location of the stone and with irrigation dilated the system so I was eventually able to get the rigid nephroscope into the proper calyx.  Visualization was difficult throughout the procedure due to some continued bleeding.  Once the stone was visualized the trilogy lithotrite was then used to fragment the stone with the settings of 80% on impact and 80% on ultrasonic.  Once the stone was partially fragmented some of the fragments were removed with grasping forceps.  I then alternated the lithotrite and grasping forceps to remove the remaining stone fragments with use of fluoroscopy throughout to aid localization.  Once there was no further fluoroscopic or visual evidence of residual stone material, the scope was removed and a 22 [place] catheter was advanced over the wire into the renal pelvis.  The balloon was initially filled with 2 mL of sterile fluid but that was then reduced to 1 mL.  The access sheath was partially backed out and a short nephro view 5 French angled tip catheter was advanced over the safety wire into the distal ureter.  The working wire and safety wires were then removed and 2-0 silk was used to secure the safety and nephrostomy catheters to the skin  and tighten the skin incision.  Omnipaque was then injected in an antegrade fashion and there was some extravasation proximal along the tract but there was antegrade flow.  There appeared to be some clot in the lower calyx.  The access sheath was then cut away from the nephrostomy catheter.  The nephro view catheter was capped and the nephrostomy catheter was placed to straight drainage.  There was some oozing around the  nephrostomy site so pressure was held for 5 minutes before removing the drapes and placing a dressing.  A compression dressing was placed.  He was then rolled back supine on the holding room stretcher and his anesthetic was reversed before he was returned to the PACU.  There were no complications apart from moderate bleeding throughout the procedure.  1 stone fragment was sent for analysis and the remainder were given to the patient.

## 2021-12-07 NOTE — Procedures (Signed)
  Procedure:  Left antegrade nephroureteral catheter placement LLP calc to bladder Preprocedure diagnosis: The encounter diagnosis was Ureteral stone.  Postprocedure diagnosis: same EBL:    minimal Complications:   none immediate  See full dictation in [facility].  Dillard Cannon MD Main # [phone] Pager  [phone] Mobile [phone]

## 2021-12-07 NOTE — Discharge Instructions (Signed)
Hold meloxicam for 7 days.

## 2021-12-07 NOTE — Plan of Care (Signed)
  Problem: Coping: Goal: Level of anxiety will decrease Outcome: Progressing   Problem: Pain Managment: Goal: General experience of comfort will improve Outcome: Progressing   Problem: Safety: Goal: Ability to remain free from injury will improve Outcome: Progressing   

## 2021-12-07 NOTE — Interval H&P Note (Signed)
History and Physical Interval Note:  12/07/2021 8:54 AM  Rodney Miller  has presented today for surgery, with the diagnosis of LEFT URETERAL STONE.  The various methods of treatment have been discussed with the patient and family. After consideration of risks, benefits and other options for treatment, the patient has consented to  Procedure(s): FIRST STAGE LEFT NEPHROLITHOTOMY PERCUTANEOUS (Left) as a surgical intervention.  The patient's history has been reviewed, patient examined, no change in status, stable for surgery.  I have reviewed the patient's chart and labs.  Questions were answered to the patient's satisfaction.     Irine Seal

## 2021-12-08 ENCOUNTER — Observation Stay (HOSPITAL_COMMUNITY): Payer: Medicare HMO

## 2021-12-08 ENCOUNTER — Encounter (HOSPITAL_COMMUNITY): Payer: Self-pay | Admitting: Urology

## 2021-12-08 DIAGNOSIS — Z466 Encounter for fitting and adjustment of urinary device: Secondary | ICD-10-CM | POA: Diagnosis not present

## 2021-12-08 LAB — BASIC METABOLIC PANEL
Anion gap: 6 (ref 5–15)
BUN: 19 mg/dL (ref 8–23)
CO2: 22 mmol/L (ref 22–32)
Calcium: 8 mg/dL — ABNORMAL LOW (ref 8.9–10.3)
Chloride: 111 mmol/L (ref 98–111)
Creatinine, Ser: 1.18 mg/dL (ref 0.61–1.24)
GFR, Estimated: >60 mL/min (ref >60)
Glucose, Bld: 83 mg/dL (ref 70–99)
Potassium: 4.5 mmol/L (ref 3.5–5.1)
Sodium: 139 mmol/L (ref 135–145)

## 2021-12-08 LAB — HEMOGLOBIN AND HEMATOCRIT, BLOOD
HCT: 28.1 % — ABNORMAL LOW (ref 39.0–52.0)
Hemoglobin: 9.1 g/dL — ABNORMAL LOW (ref 13.0–17.0)

## 2021-12-08 NOTE — Progress Notes (Signed)
Dressing at Nephrostomy site was observed to be saturated and it was changed twice during the shift.

## 2021-12-08 NOTE — Plan of Care (Signed)
  Problem: Clinical Measurements: Goal: Ability to maintain clinical measurements within normal limits will improve Outcome: Progressing Goal: Will remain free from infection Outcome: Progressing   Problem: Education: Goal: Ability to describe self-care measures that may prevent or decrease complications (Diabetes Survival Skills Education) will improve Outcome: Not Progressing

## 2021-12-08 NOTE — Progress Notes (Signed)
Pt's wife notified staff that her husband was in the bathroom insisting on taking a shower. When entered the room the patient's had removed the dressing from his surgical incision, his foley catheter and IV tubing. Charge nurse casey and MD Hampton Abbot was notified. Per MD foley was not replaced, pt voided x 2, IV replaced and dressing done. Will continue with plan of care.

## 2021-12-08 NOTE — Progress Notes (Signed)
1 Day Post-Op Subjective: Patient feeling fairly well having some pain in the area of the nephrostomy tube.  Urine in the nephrostomy tube still bloody with some clots.  Hemoglobin is 9.1, down from 11 preop KUB reviewed today shows nephrostomy tube in good position do not see any obvious retained fragments but there is significant bowel gas in this area which makes interpretation difficult for small fragments.  Objective: Vital signs in last 24 hours: Temp:  [96.9 F (36.1 C)-98.3 F (36.8 C)] 98 F (36.7 C) (09/23 0355) Pulse Rate:  [53-88] 79 (09/23 0355) Resp:  [7-18] 18 (09/23 0355) BP: (100-168)/(52-87) 113/59 (09/23 0355) SpO2:  [96 %-100 %] 98 % (09/23 0355) Weight:  [77.2 kg] 77.2 kg (09/22 1610)  Intake/Output from previous day: 09/22 0701 - 09/23 0700 In: 3024.4 [I.V.:2839.8; IV Piggyback:184.6] Out: 1450 [Urine:950; Blood:500] Intake/Output this shift: No intake/output data recorded.  Physical Exam:  General: Alert and oriented  Abdomen: Soft, ND Incisions: Ext: NT, No erythema  Lab Results: Recent Labs    12/07/21 1447 12/07/21 1930 12/08/21 0517  HGB 10.8* 11.0* 9.1*  HCT 33.3* 34.5* 28.1*   BMET Recent Labs    12/07/21 0838 12/08/21 0517  NA 138 139  K 4.8 4.5  CL 110 111  CO2 23 22  GLUCOSE 138* 83  BUN 17 19  CREATININE 1.01 1.18  CALCIUM 8.9 8.0*     Studies/Results: DG C-Arm 1-60 Min-No Report  Result Date: 12/07/2021 CLINICAL DATA:  Left percutaneous nephrolithotomy. EXAM: ABDOMEN - 1 VIEW; DG C-ARM 1-60 MIN-NO REPORT Radiation exposure index: 9.8 mGy. COMPARISON:  None Available. FINDINGS: Two intra procedural fluoroscopic images were obtained. The first image demonstrates large-bore catheter in left upper quadrant with wires passing into expected position of ureter. Second image demonstrates catheter within nondilated left renal pelvis and collecting system. IMPRESSION: Fluoroscopic guidance provided during left percutaneous  nephrolithotomy. Electronically Signed   By: Marijo Conception M.D.   On: 12/07/2021 15:19   DG C-Arm 1-60 Min-No Report  Result Date: 12/07/2021 CLINICAL DATA:  Left percutaneous nephrolithotomy. EXAM: ABDOMEN - 1 VIEW; DG C-ARM 1-60 MIN-NO REPORT Radiation exposure index: 9.8 mGy. COMPARISON:  None Available. FINDINGS: Two intra procedural fluoroscopic images were obtained. The first image demonstrates large-bore catheter in left upper quadrant with wires passing into expected position of ureter. Second image demonstrates catheter within nondilated left renal pelvis and collecting system. IMPRESSION: Fluoroscopic guidance provided during left percutaneous nephrolithotomy. Electronically Signed   By: Marijo Conception M.D.   On: 12/07/2021 15:19   DG Abd 1 View  Result Date: 12/07/2021 CLINICAL DATA:  Left percutaneous nephrolithotomy. EXAM: ABDOMEN - 1 VIEW; DG C-ARM 1-60 MIN-NO REPORT Radiation exposure index: 9.8 mGy. COMPARISON:  None Available. FINDINGS: Two intra procedural fluoroscopic images were obtained. The first image demonstrates large-bore catheter in left upper quadrant with wires passing into expected position of ureter. Second image demonstrates catheter within nondilated left renal pelvis and collecting system. IMPRESSION: Fluoroscopic guidance provided during left percutaneous nephrolithotomy. Electronically Signed   By: Marijo Conception M.D.   On: 12/07/2021 15:19   IR URETERAL STENT LEFT NEW ACCESS W/O SEP NEPHROSTOMY CATH  Result Date: 12/07/2021 CLINICAL DATA:  Symptomatic left nephrolithiasis, planned percutaneous nephrolithotomy EXAM: LEFT PERCUTANEOUS NEPHROURETERAL CATHETER PLACEMENT UNDER FLUOROSCOPIC GUIDANCE FLUOROSCOPY: Radiation Exposure Index (as provided by the fluoroscopic device): 18 mGy air Kerma TECHNIQUE: The procedure, risks (including but not limited to bleeding, infection, organ damage ), benefits, and alternatives were explained to  the patient. Questions regarding  the procedure were encouraged and answered. The patient understands and consents to the procedure. Left flank region prepped with Betadine, draped in usual sterile fashion, infiltrated locally with 1% lidocaine. Intravenous Fentanyl 126mg and Versed '2mg'$  were administered as conscious sedation during continuous monitoring of the patient's level of consciousness and physiological / cardiorespiratory status by the radiology RN, with a total moderate sedation time of 12 minutes. Patient received Rocephin 2 g IV preprocedure. Under real-time fluoroscopic guidance, a 21-gauge trocar needle was advanced into a posterior lower pole calyx using the peripheral radiodense calculus as a guide. A 018 guidewire advanced easily into the central aspect of the renal collecting system. Needle was exchanged over a guidewire for transitional dilator. Contrast injection confirmed appropriate positioning. Catheter was exchanged over a guidewire for a 5 [place] catheter, advanced down the ureter into the urinary bladder. Radiograph confirms appropriate nephroureteral catheter positioning. Catheter capped and secured externally. The patient tolerated the procedure well. COMPLICATIONS: COMPLICATIONS None. IMPRESSION: 1. Technically successful left percutaneous nephroureteral catheter placement in preparation for nephrolithotomy. Electronically Signed   By: DLucrezia EuropeM.D.   On: 12/07/2021 13:44    Assessment/Plan: 1.  Status post left PCNL with difficult hematuria and slightly lower hemoglobin but stable Lan/recommendation.  We will observe today recheck hemoglobin in a.m. for DC home tomorrow if urine clearing    LOS: 0 days   Rodney Haggard9/23/2023, 7:46 AM

## 2021-12-08 NOTE — Progress Notes (Signed)
Mobility Specialist - Progress Note   12/08/21 1108  Mobility  HOB Elevated/Bed Position Self regulated  Activity Ambulated independently in hallway  Range of Motion/Exercises Active  Level of Assistance Independent  Assistive Device None  Distance Ambulated (ft) 1000 ft  Activity Response Tolerated well  $Mobility charge 1 Mobility   Pt was found in bed and agreeable to ambulate. Had no complaints and at EOS returned to recliner chair with all necessities in reach.  Ferd Hibbs Mobility Specialist

## 2021-12-09 DIAGNOSIS — Z9641 Presence of insulin pump (external) (internal): Secondary | ICD-10-CM | POA: Diagnosis not present

## 2021-12-09 DIAGNOSIS — E039 Hypothyroidism, unspecified: Secondary | ICD-10-CM | POA: Diagnosis not present

## 2021-12-09 DIAGNOSIS — N200 Calculus of kidney: Secondary | ICD-10-CM | POA: Diagnosis not present

## 2021-12-09 DIAGNOSIS — Z87442 Personal history of urinary calculi: Secondary | ICD-10-CM | POA: Diagnosis not present

## 2021-12-09 DIAGNOSIS — Z79899 Other long term (current) drug therapy: Secondary | ICD-10-CM | POA: Diagnosis not present

## 2021-12-09 DIAGNOSIS — E119 Type 2 diabetes mellitus without complications: Secondary | ICD-10-CM | POA: Diagnosis not present

## 2021-12-09 DIAGNOSIS — N202 Calculus of kidney with calculus of ureter: Secondary | ICD-10-CM | POA: Diagnosis not present

## 2021-12-09 DIAGNOSIS — I10 Essential (primary) hypertension: Secondary | ICD-10-CM | POA: Diagnosis not present

## 2021-12-09 DIAGNOSIS — Z791 Long term (current) use of non-steroidal anti-inflammatories (NSAID): Secondary | ICD-10-CM | POA: Diagnosis not present

## 2021-12-09 DIAGNOSIS — Z833 Family history of diabetes mellitus: Secondary | ICD-10-CM | POA: Diagnosis not present

## 2021-12-09 DIAGNOSIS — E1065 Type 1 diabetes mellitus with hyperglycemia: Secondary | ICD-10-CM | POA: Diagnosis not present

## 2021-12-09 DIAGNOSIS — Z79890 Hormone replacement therapy (postmenopausal): Secondary | ICD-10-CM | POA: Diagnosis not present

## 2021-12-09 DIAGNOSIS — D62 Acute posthemorrhagic anemia: Secondary | ICD-10-CM | POA: Diagnosis not present

## 2021-12-09 LAB — CBC
HCT: 28.7 % — ABNORMAL LOW (ref 39.0–52.0)
Hemoglobin: 9.4 g/dL — ABNORMAL LOW (ref 13.0–17.0)
MCH: 33.3 pg (ref 26.0–34.0)
MCHC: 32.8 g/dL (ref 30.0–36.0)
MCV: 101.8 fL — ABNORMAL HIGH (ref 80.0–100.0)
Platelets: 205 10*3/uL (ref 150–400)
RBC: 2.82 MIL/uL — ABNORMAL LOW (ref 4.22–5.81)
RDW: 13.1 % (ref 11.5–15.5)
WBC: 11.9 10*3/uL — ABNORMAL HIGH (ref 4.0–10.5)
nRBC: 0 % (ref 0.0–0.2)

## 2021-12-09 LAB — GLUCOSE, CAPILLARY: Glucose-Capillary: 232 mg/dL — ABNORMAL HIGH (ref 70–99)

## 2021-12-09 NOTE — Plan of Care (Signed)
  Problem: Clinical Measurements: Goal: Diagnostic test results will improve Outcome: Progressing Goal: Respiratory complications will improve Outcome: Progressing Goal: Cardiovascular complication will be avoided Outcome: Progressing   

## 2021-12-09 NOTE — Plan of Care (Signed)
  Problem: Education: Goal: Ability to describe self-care measures that may prevent or decrease complications (Diabetes Survival Skills Education) will improve Outcome: Progressing   Problem: Coping: Goal: Ability to adjust to condition or change in health will improve Outcome: Progressing   Problem: Pain Managment: Goal: General experience of comfort will improve Outcome: Progressing   Problem: Safety: Goal: Ability to remain free from injury will improve Outcome: Progressing

## 2021-12-09 NOTE — Progress Notes (Signed)
2 Days Post-Op Subjective: Patient feeling fairly well continues to have some left-sided flank discomfort around the tube.  His nephrostomy drainage is still too bloody to remove.  Patient pulled Foley catheter out last night but is voiding satisfactorily and not having significant hematuria after having initial hematuria as expected.  Objective: Vital signs in last 24 hours: Temp:  [97.9 F (36.6 C)-98.7 F (37.1 C)] 97.9 F (36.6 C) (09/24 0417) Pulse Rate:  [77-102] 77 (09/24 0523) Resp:  [16-20] 18 (09/24 0417) BP: (147-165)/(67-96) 159/72 (09/24 0523) SpO2:  [96 %-100 %] 96 % (09/24 0417)  Intake/Output from previous day: 09/23 0701 - 09/24 0700 In: 2192.9 [P.O.:720; I.V.:1357.5; IV Piggyback:115.4] Out: 3100 [Urine:3100] Intake/Output this shift: No intake/output data recorded.  Physical Exam:  General: Alert and oriented CV: RRR Lungs: Clear Abdomen: Soft, ND nephrostomy tube with bloody urine, few clots in the tubing and some bloody drainage around the nephrostomy site. Incisions: Ext: NT, No erythema  Lab Results: Recent Labs    12/07/21 1447 12/07/21 1930 12/08/21 0517  HGB 10.8* 11.0* 9.1*  HCT 33.3* 34.5* 28.1*   BMET Recent Labs    12/07/21 0838 12/08/21 0517  NA 138 139  K 4.8 4.5  CL 110 111  CO2 23 22  GLUCOSE 138* 83  BUN 17 19  CREATININE 1.01 1.18  CALCIUM 8.9 8.0*     Studies/Results: Abdomen 1 View (KUB)  Result Date: 12/08/2021 CLINICAL DATA:  Post left percutaneous nephro lithotomy. EXAM: ABDOMEN - 1 VIEW COMPARISON:  December 07, 2021 FINDINGS: A percutaneous nephrostomy tube is identified in the left upper quadrant. The distal tube is somewhat tortuous. Recommend correlation to determine whether this tube is functioning normally. A left ureteral stent is identified, terminating in the left lower pelvis, likely in the distal ureter. This stent does not appear to extend into the bladder. No other interval changes. IMPRESSION: 1. There  is a left percutaneous nephrostomy tube. The distal tube takes a somewhat tortuous course. Recommend correlation to determine whether the tube is draining urine normally. 2. A left ureteral stent extends from the region of the left kidney into the distal left ureter. The ureteral stent does not appear to extend into the bladder. 3. No other changes. Electronically Signed   By: Dorise Bullion III M.D.   On: 12/08/2021 07:47   DG C-Arm 1-60 Min-No Report  Result Date: 12/07/2021 CLINICAL DATA:  Left percutaneous nephrolithotomy. EXAM: ABDOMEN - 1 VIEW; DG C-ARM 1-60 MIN-NO REPORT Radiation exposure index: 9.8 mGy. COMPARISON:  None Available. FINDINGS: Two intra procedural fluoroscopic images were obtained. The first image demonstrates large-bore catheter in left upper quadrant with wires passing into expected position of ureter. Second image demonstrates catheter within nondilated left renal pelvis and collecting system. IMPRESSION: Fluoroscopic guidance provided during left percutaneous nephrolithotomy. Electronically Signed   By: Marijo Conception M.D.   On: 12/07/2021 15:19   DG C-Arm 1-60 Min-No Report  Result Date: 12/07/2021 CLINICAL DATA:  Left percutaneous nephrolithotomy. EXAM: ABDOMEN - 1 VIEW; DG C-ARM 1-60 MIN-NO REPORT Radiation exposure index: 9.8 mGy. COMPARISON:  None Available. FINDINGS: Two intra procedural fluoroscopic images were obtained. The first image demonstrates large-bore catheter in left upper quadrant with wires passing into expected position of ureter. Second image demonstrates catheter within nondilated left renal pelvis and collecting system. IMPRESSION: Fluoroscopic guidance provided during left percutaneous nephrolithotomy. Electronically Signed   By: Marijo Conception M.D.   On: 12/07/2021 15:19   DG Abd 1 View  Result Date: 12/07/2021 CLINICAL DATA:  Left percutaneous nephrolithotomy. EXAM: ABDOMEN - 1 VIEW; DG C-ARM 1-60 MIN-NO REPORT Radiation exposure index: 9.8 mGy.  COMPARISON:  None Available. FINDINGS: Two intra procedural fluoroscopic images were obtained. The first image demonstrates large-bore catheter in left upper quadrant with wires passing into expected position of ureter. Second image demonstrates catheter within nondilated left renal pelvis and collecting system. IMPRESSION: Fluoroscopic guidance provided during left percutaneous nephrolithotomy. Electronically Signed   By: Marijo Conception M.D.   On: 12/07/2021 15:19   IR URETERAL STENT LEFT NEW ACCESS W/O SEP NEPHROSTOMY CATH  Result Date: 12/07/2021 CLINICAL DATA:  Symptomatic left nephrolithiasis, planned percutaneous nephrolithotomy EXAM: LEFT PERCUTANEOUS NEPHROURETERAL CATHETER PLACEMENT UNDER FLUOROSCOPIC GUIDANCE FLUOROSCOPY: Radiation Exposure Index (as provided by the fluoroscopic device): 18 mGy air Kerma TECHNIQUE: The procedure, risks (including but not limited to bleeding, infection, organ damage ), benefits, and alternatives were explained to the patient. Questions regarding the procedure were encouraged and answered. The patient understands and consents to the procedure. Left flank region prepped with Betadine, draped in usual sterile fashion, infiltrated locally with 1% lidocaine. Intravenous Fentanyl 149mg and Versed '2mg'$  were administered as conscious sedation during continuous monitoring of the patient's level of consciousness and physiological / cardiorespiratory status by the radiology RN, with a total moderate sedation time of 12 minutes. Patient received Rocephin 2 g IV preprocedure. Under real-time fluoroscopic guidance, a 21-gauge trocar needle was advanced into a posterior lower pole calyx using the peripheral radiodense calculus as a guide. A 018 guidewire advanced easily into the central aspect of the renal collecting system. Needle was exchanged over a guidewire for transitional dilator. Contrast injection confirmed appropriate positioning. Catheter was exchanged over a guidewire for  a 5 [place] catheter, advanced down the ureter into the urinary bladder. Radiograph confirms appropriate nephroureteral catheter positioning. Catheter capped and secured externally. The patient tolerated the procedure well. COMPLICATIONS: COMPLICATIONS None. IMPRESSION: 1. Technically successful left percutaneous nephroureteral catheter placement in preparation for nephrolithotomy. Electronically Signed   By: DLucrezia EuropeM.D.   On: 12/07/2021 13:44    Assessment/Plan: 1.  Status post PCNL continued bleeding per nephrostomy tube. Plans/recommendation did not feel comfortable sending this patient home with nephrostomy tube care.  Hopefully this will clear enough to removed tomorrow.  We will keep today for observation check CBC today to ensure hemoglobin stability. Hep-Lock IV    LOS: 0 days   GRemi Haggard9/24/2023, 7:57 AM

## 2021-12-09 NOTE — Progress Notes (Signed)
  Transition of Care (TOC) Screening Note   Patient Details  Name: Rodney Miller Date of Birth: 10/31/1950   Transition of [facility]) CM/SW Contact:    Dessa Phi, RN Phone Number: 12/09/2021, 3:47 PM    Transition of Care Department [facility]) has reviewed patient and no TOC needs have been identified at this time. We will continue to monitor patient advancement through interdisciplinary progression rounds. If new patient transition needs arise, please place a TOC consult.

## 2021-12-10 ENCOUNTER — Other Ambulatory Visit: Payer: Self-pay

## 2021-12-10 DIAGNOSIS — E1065 Type 1 diabetes mellitus with hyperglycemia: Secondary | ICD-10-CM | POA: Diagnosis not present

## 2021-12-10 DIAGNOSIS — D62 Acute posthemorrhagic anemia: Secondary | ICD-10-CM | POA: Diagnosis not present

## 2021-12-10 LAB — GLUCOSE, CAPILLARY
Glucose-Capillary: 97 mg/dL (ref 70–99)
Glucose-Capillary: 157 mg/dL — ABNORMAL HIGH (ref 70–99)
Glucose-Capillary: 310 mg/dL — ABNORMAL HIGH (ref 70–99)
Glucose-Capillary: 255 mg/dL — ABNORMAL HIGH (ref 70–99)
Glucose-Capillary: 124 mg/dL — ABNORMAL HIGH (ref 70–99)
Glucose-Capillary: 135 mg/dL — ABNORMAL HIGH (ref 70–99)

## 2021-12-10 MED ORDER — ORAL CARE MOUTH RINSE: 15.0000 mL | OROMUCOSAL | Status: DC | PRN

## 2021-12-10 NOTE — Anesthesia Postprocedure Evaluation (Signed)
Anesthesia Post Note  Patient: Rodney Miller  Procedure(s) Performed: FIRST STAGE LEFT NEPHROLITHOTOMY PERCUTANEOUS (Left: Flank)     Patient location during evaluation: PACU Anesthesia Type: General Level of consciousness: awake and alert Pain management: pain level controlled Vital Signs Assessment: post-procedure vital signs reviewed and stable Respiratory status: spontaneous breathing, nonlabored ventilation and respiratory function stable Cardiovascular status: blood pressure returned to baseline and stable Postop Assessment: no apparent nausea or vomiting Anesthetic complications: no   No notable events documented.  Last Vitals:  Vitals:   12/09/21 2039 12/10/21 0626  BP: (!) 147/65 (!) 157/74  Pulse: 74 68  Resp: 18 15  Temp: 36.8 C 36.7 C  SpO2: 100% 98%    Last Pain:  Vitals:   12/10/21 0626  TempSrc: Oral  PainSc:                  [name]

## 2021-12-10 NOTE — Discharge Summary (Signed)
Physician Discharge Summary  Patient ID: Rodney Miller MRN: [id number] DOB/AGE: [birth year] 71 y.o.  Admit date: 12/07/2021 Discharge date: 12/10/2021  Admission Diagnoses:  Left renal stone  Discharge Diagnoses:  Principal Problem:   Left renal stone Active Problems:   Acute blood loss anemia   Past Medical History:  Diagnosis Date   Diabetes mellitus without complication ([place])    Hypertension    Hypothyroidism    Kidney stones     Surgeries: Procedure(s): FIRST STAGE LEFT NEPHROLITHOTOMY PERCUTANEOUS on 12/07/2021   Consultants (if any):   Discharged Condition: Improved  Hospital Course: Rodney Miller is an 71 y.o. male who was admitted 12/07/2021 with a diagnosis of Left renal stone and went to the operating room on 12/07/2021 and underwent the above named procedures.  He had a fair amount of bleeding during the procedure but was rendered stone free as evidenced by a post op KUB on 12/08/21.  His Hgb dropped to 9.1 but was 9.4 on 12/09/21 and he didn't require transfusion. He had a foley post op but pulled it inadvertently but has been able to void. He continued to have improvement in the bleeding and pain and this morning the urine was felt to be clear enough to remove the tube which he tolerated well.   After tube removal, he was able to ambulate and had reduced pain and was felt to be ready for discharge.   He was given perioperative antibiotics:  Anti-infectives (From admission, onward)    Start     Dose/Rate Route Frequency Ordered Stop   12/07/21 2000  ceFAZolin (ANCEF) IVPB 1 g/50 mL premix        1 g 100 mL/hr over 30 Minutes Intravenous Every 8 hours 12/07/21 1208 12/14/21 2159   12/07/21 0830  ceFAZolin (ANCEF) IVPB 2g/100 mL premix        2 g 200 mL/hr over 30 Minutes Intravenous 30 min pre-op 12/07/21 0815 12/07/21 1151   12/07/21 0830  cefTRIAXone (ROCEPHIN) 2 g in sodium chloride 0.9 % 100 mL IVPB        2 g 200 mL/hr over 30 Minutes Intravenous  Once 12/07/21 0821  12/07/21 1748     .  He was given sequential compression devices for DVT prophylaxis.  He benefited maximally from the hospital stay and there were no complications.    Recent vital signs:  Vitals:   12/09/21 2039 12/10/21 0626  BP: (!) 147/65 (!) 157/74  Pulse: 74 68  Resp: 18 15  Temp: 98.2 F (36.8 C) 98 F (36.7 C)  SpO2: 100% 98%    Recent laboratory studies:  Lab Results  Component Value Date   HGB 9.4 (L) 12/09/2021   HGB 9.1 (L) 12/08/2021   HGB 11.0 (L) 12/07/2021   Lab Results  Component Value Date   WBC 11.9 (H) 12/09/2021   PLT 205 12/09/2021   Lab Results  Component Value Date   INR 1.0 12/07/2021   Lab Results  Component Value Date   NA 139 12/08/2021   K 4.5 12/08/2021   CL 111 12/08/2021   CO2 22 12/08/2021   BUN 19 12/08/2021   CREATININE 1.18 12/08/2021   GLUCOSE 83 12/08/2021    Discharge Medications:   Allergies as of 12/10/2021   No Known Allergies      Medication List     TAKE these medications    atorvastatin 20 MG tablet Commonly known as: LIPITOR Take 1 tablet (20 mg total) by mouth daily.  CeleXA 20 MG tablet Generic drug: citalopram Take 20 mg by mouth daily.   Dexcom G6 Sensor Misc 1 Device by Does not apply route as directed.   Dexcom G6 Transmitter Misc 1 Device by Does not apply route as directed.   insulin aspart 100 UNIT/ML injection Commonly known as: NovoLOG USE UP TO 75 UNITS DAILY VIA INSULIN PUMP AS DIRECTED What changed: additional instructions   levothyroxine 75 MCG tablet Commonly known as: SYNTHROID TAKE ONE TABLET BY MOUTH DAILY BEFORE BREAKFAST   lisinopril 20 MG tablet Commonly known as: ZESTRIL Take 20 mg by mouth at bedtime.   meloxicam 7.5 MG tablet Commonly known as: MOBIC Take 1 tablet (7.5 mg total) by mouth daily.   multivitamin with minerals Tabs tablet Take 1 tablet by mouth daily.   T:slim Insulin Pump Devi by Does not apply route.   traMADol 50 MG tablet Commonly  known as: ULTRAM TAKE 1 TABLET BY MOUTH EVERY 6 HOURS AS NEEDED   VITAMIN D3 COMPLETE PO Take 1 tablet by mouth daily.        Diagnostic Studies: Abdomen 1 View (KUB)  Result Date: 12/08/2021 CLINICAL DATA:  Post left percutaneous nephro lithotomy. EXAM: ABDOMEN - 1 VIEW COMPARISON:  December 07, 2021 FINDINGS: A percutaneous nephrostomy tube is identified in the left upper quadrant. The distal tube is somewhat tortuous. Recommend correlation to determine whether this tube is functioning normally. A left ureteral stent is identified, terminating in the left lower pelvis, likely in the distal ureter. This stent does not appear to extend into the bladder. No other interval changes. IMPRESSION: 1. There is a left percutaneous nephrostomy tube. The distal tube takes a somewhat tortuous course. Recommend correlation to determine whether the tube is draining urine normally. 2. A left ureteral stent extends from the region of the left kidney into the distal left ureter. The ureteral stent does not appear to extend into the bladder. 3. No other changes. Electronically Signed   By: Dorise Bullion III M.D.   On: 12/08/2021 07:47   DG C-Arm 1-60 Min-No Report  Result Date: 12/07/2021 CLINICAL DATA:  Left percutaneous nephrolithotomy. EXAM: ABDOMEN - 1 VIEW; DG C-ARM 1-60 MIN-NO REPORT Radiation exposure index: 9.8 mGy. COMPARISON:  None Available. FINDINGS: Two intra procedural fluoroscopic images were obtained. The first image demonstrates large-bore catheter in left upper quadrant with wires passing into expected position of ureter. Second image demonstrates catheter within nondilated left renal pelvis and collecting system. IMPRESSION: Fluoroscopic guidance provided during left percutaneous nephrolithotomy. Electronically Signed   By: Marijo Conception M.D.   On: 12/07/2021 15:19   DG C-Arm 1-60 Min-No Report  Result Date: 12/07/2021 CLINICAL DATA:  Left percutaneous nephrolithotomy. EXAM: ABDOMEN - 1  VIEW; DG C-ARM 1-60 MIN-NO REPORT Radiation exposure index: 9.8 mGy. COMPARISON:  None Available. FINDINGS: Two intra procedural fluoroscopic images were obtained. The first image demonstrates large-bore catheter in left upper quadrant with wires passing into expected position of ureter. Second image demonstrates catheter within nondilated left renal pelvis and collecting system. IMPRESSION: Fluoroscopic guidance provided during left percutaneous nephrolithotomy. Electronically Signed   By: Marijo Conception M.D.   On: 12/07/2021 15:19   DG Abd 1 View  Result Date: 12/07/2021 CLINICAL DATA:  Left percutaneous nephrolithotomy. EXAM: ABDOMEN - 1 VIEW; DG C-ARM 1-60 MIN-NO REPORT Radiation exposure index: 9.8 mGy. COMPARISON:  None Available. FINDINGS: Two intra procedural fluoroscopic images were obtained. The first image demonstrates large-bore catheter in left upper quadrant with wires  passing into expected position of ureter. Second image demonstrates catheter within nondilated left renal pelvis and collecting system. IMPRESSION: Fluoroscopic guidance provided during left percutaneous nephrolithotomy. Electronically Signed   By: Marijo Conception M.D.   On: 12/07/2021 15:19   IR URETERAL STENT LEFT NEW ACCESS W/O SEP NEPHROSTOMY CATH  Result Date: 12/07/2021 CLINICAL DATA:  Symptomatic left nephrolithiasis, planned percutaneous nephrolithotomy EXAM: LEFT PERCUTANEOUS NEPHROURETERAL CATHETER PLACEMENT UNDER FLUOROSCOPIC GUIDANCE FLUOROSCOPY: Radiation Exposure Index (as provided by the fluoroscopic device): 18 mGy air Kerma TECHNIQUE: The procedure, risks (including but not limited to bleeding, infection, organ damage ), benefits, and alternatives were explained to the patient. Questions regarding the procedure were encouraged and answered. The patient understands and consents to the procedure. Left flank region prepped with Betadine, draped in usual sterile fashion, infiltrated locally with 1% lidocaine.  Intravenous Fentanyl 132mg and Versed '2mg'$  were administered as conscious sedation during continuous monitoring of the patient's level of consciousness and physiological / cardiorespiratory status by the radiology RN, with a total moderate sedation time of 12 minutes. Patient received Rocephin 2 g IV preprocedure. Under real-time fluoroscopic guidance, a 21-gauge trocar needle was advanced into a posterior lower pole calyx using the peripheral radiodense calculus as a guide. A 018 guidewire advanced easily into the central aspect of the renal collecting system. Needle was exchanged over a guidewire for transitional dilator. Contrast injection confirmed appropriate positioning. Catheter was exchanged over a guidewire for a 5 [place] catheter, advanced down the ureter into the urinary bladder. Radiograph confirms appropriate nephroureteral catheter positioning. Catheter capped and secured externally. The patient tolerated the procedure well. COMPLICATIONS: COMPLICATIONS None. IMPRESSION: 1. Technically successful left percutaneous nephroureteral catheter placement in preparation for nephrolithotomy. Electronically Signed   By: DLucrezia EuropeM.D.   On: 12/07/2021 13:44    Disposition: Discharge disposition: 01-Home or Self Care       Discharge Instructions     Discontinue IV   Complete by: As directed         Follow-up Information     DHollace Hayward NP Follow up on 12/14/2021.   Why: 1:15pm Contact information: [phone]                  Signed: JIrine Seal9/25/2023, 10:37 AM

## 2021-12-10 NOTE — Progress Notes (Signed)
3 Days Post-Op Subjective: The nephrostomy is draining pink urine with reduced clots.  He is voiding into a condom cath and the urine is light pink.  His Hgb was stable yesterday at 9.4.  He remains weak and hasn't been ambulating yet.     Objective: Vital signs in last 24 hours: Temp:  [97.6 F (36.4 C)-98.5 F (36.9 C)] 98 F (36.7 C) (09/25 0626) Pulse Rate:  [68-93] 68 (09/25 0626) Resp:  [15-20] 15 (09/25 0626) BP: (143-157)/(63-74) 157/74 (09/25 0626) SpO2:  [98 %-100 %] 98 % (09/25 0626)  Intake/Output from previous day: 09/24 0701 - 09/25 0700 In: 2911.5 [P.O.:820; I.V.:1891.5; IV Piggyback:200] Out: 4750 [Urine:4750] Intake/Output this shift: Total I/O In: 1471.2 [P.O.:100; I.V.:1221.2; IV Piggyback:150] Out: 2775 [Urine:2775]  Physical Exam:  General: Alert and oriented CV: RRR Lungs: Clear Abdomen: Soft, ND nephrostomy tube with pink urine, very few clots in the tubing.  The dressing is dry. Ext: NT, No erythema  Lab Results: Recent Labs    12/07/21 1930 12/08/21 0517 12/09/21 1449  HGB 11.0* 9.1* 9.4*  HCT 34.5* 28.1* 28.7*   BMET Recent Labs    12/07/21 0838 12/08/21 0517  NA 138 139  K 4.8 4.5  CL 110 111  CO2 23 22  GLUCOSE 138* 83  BUN 17 19  CREATININE 1.01 1.18  CALCIUM 8.9 8.0*     Studies/Results: No results found. KUB reviewed.    Left nephrostomy tube removed and a fresh dressing was placed.     Assessment/Plan: 1.  Status post PCNL.  There is no evidence of active bleeding.     Plans/recommendation:  Will have patient ambulate to make sure he will be able to manage at home and plan for discharge today after he has had time to walk.   LOS: 1 day   Irine Seal 12/10/2021, 6:58 AM   Patient ID: Rodney Miller, male   DOB: 1950-08-17, 71 y.o.   MRN: [id number]

## 2021-12-14 ENCOUNTER — Telehealth: Payer: Self-pay

## 2021-12-14 LAB — CALCULI, WITH PHOTOGRAPH (CLINICAL LAB)
Calcium Oxalate Dihydrate: 10 %
Calcium Oxalate Monohydrate: 50 %
Uric Acid Calculi: 40 %
Weight Calculi: 207 mg

## 2021-12-14 NOTE — Patient Outreach (Signed)
  Care Coordination TOC Note Transition Care Management Follow-up Telephone Call Date of discharge and from where: 12/10/21-[facility]-Dx: left renal stone How have you been since you were released from the hospital? Patient reports he is recovering and doing well. He has been able to walk almost a mile at home. Denies any pain or discomfort. States he is urinating without difficulty  Any questions or concerns? No  Items Reviewed: Did the pt receive and understand the discharge instructions provided? Yes  Medications obtained and verified? Yes  Other? No  Any new allergies since your discharge? No  Dietary orders reviewed? Yes Do you have support at home? Yes -supportive wife  Home Care and Equipment/Supplies: Were home health services ordered? no If so, what is the name of the agency? N/a  Has the agency set up a time to come to the patient's home? not applicable Were any new equipment or medical supplies ordered?  No What is the name of the medical supply agency? N/a Were you able to get the supplies/equipment? not applicable Do you have any questions related to the use of the equipment or supplies? No  Functional Questionnaire: (I = Independent and D = Dependent) ADLs: I  Bathing/Dressing- I  Meal Prep- I  Eating- I  Maintaining continence- I   Transferring/Ambulation- I  Managing Meds- I  Follow up appointments reviewed:  PCP Hospital f/u appt confirmed?  Patient was not aware he needed to make PCP appt-not listed on d/c summary instructions. States he no longer sees Financial controller MD due to insurance overage. He is unable to recall name of new PCP. He will look for info or call [employer] to find out who his assigned PCP is to make an appt.  Marland Kitchen [facility] f/u appt confirmed?  Patient had f/u appt at urology office this afternoon but missed it-was not aware of it until RN CM mentioned it. States he doesn't;t recall seeing that info. He has office number and will call  to reschedule appt-declines needing assistance making appt.  Are transportation arrangements needed? No  If their condition worsens, is the pt aware to call PCP or go to the Emergency Dept.? Yes Was the patient provided with contact information for the PCP's office or ED? Yes Was to pt encouraged to call back with questions or concerns? Yes  SDOH assessments and interventions completed:   No  Care Coordination Interventions Activated:  Yes   Care Coordination Interventions:  Education provided regarding appts/schedule    Encounter Outcome:  Pt. Visit Completed     Enzo Montgomery, RN,BSN,CCM [facility] Telephonic Care Management Coordinator Direct Phone: [phone] Toll Free: [phone] Fax: [phone]

## 2021-12-14 NOTE — Patient Outreach (Signed)
  Care Coordination TOC Note Transition Care Management Unsuccessful Follow-up Telephone Call  Date of discharge and from where:  12/10/21-[facility]  Attempts:  1st Attempt  Reason for unsuccessful TCM follow-up call:  Left voice message    Hetty Blend [facility] Telephonic Care Management Coordinator Direct Phone: [phone] Toll Free: [phone] Fax: [phone]

## 2021-12-18 DIAGNOSIS — D649 Anemia, unspecified: Secondary | ICD-10-CM | POA: Diagnosis not present

## 2021-12-20 DIAGNOSIS — N200 Calculus of kidney: Secondary | ICD-10-CM | POA: Diagnosis not present

## 2021-12-31 DIAGNOSIS — E1065 Type 1 diabetes mellitus with hyperglycemia: Secondary | ICD-10-CM | POA: Diagnosis not present

## 2022-01-07 DIAGNOSIS — F339 Major depressive disorder, recurrent, unspecified: Secondary | ICD-10-CM | POA: Diagnosis not present

## 2022-01-07 DIAGNOSIS — R69 Illness, unspecified: Secondary | ICD-10-CM | POA: Diagnosis not present

## 2022-01-07 DIAGNOSIS — F0390 Unspecified dementia without behavioral disturbance: Secondary | ICD-10-CM | POA: Diagnosis not present

## 2022-01-07 DIAGNOSIS — F330 Major depressive disorder, recurrent, mild: Secondary | ICD-10-CM | POA: Diagnosis not present

## 2022-01-31 DIAGNOSIS — E1065 Type 1 diabetes mellitus with hyperglycemia: Secondary | ICD-10-CM | POA: Diagnosis not present

## 2022-02-04 IMAGING — MR MR HEAD W/O CM
8 series · 48 of 48 positions shown · non-contrast
Comparison: None.

CLINICAL DATA: Memory loss.

EXAM:
MRI HEAD WITHOUT CONTRAST
TECHNIQUE: Multiplanar, multiecho pulse sequences of the brain and surrounding
structures were obtained without intravenous contrast.

[Series 3: T1 · sagittal · 5.0mm · 0.45mm/px · 3 of 21 slices shown]
[im 1/21]
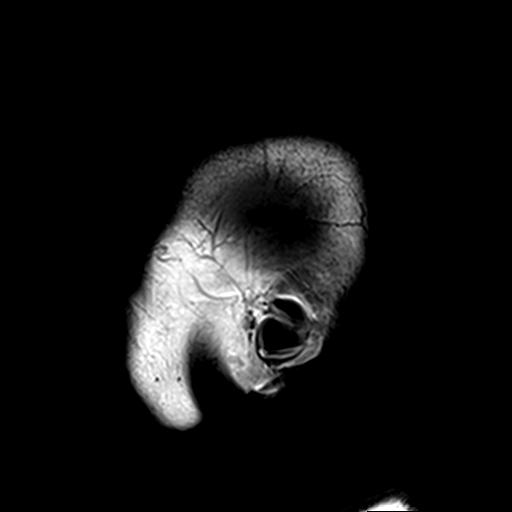
[im 11/21]
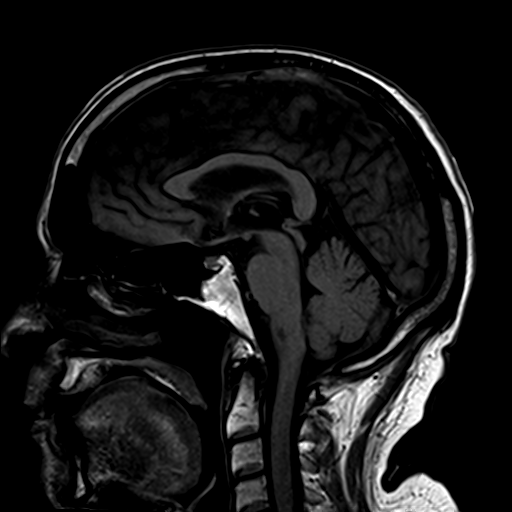
[im 21/21]
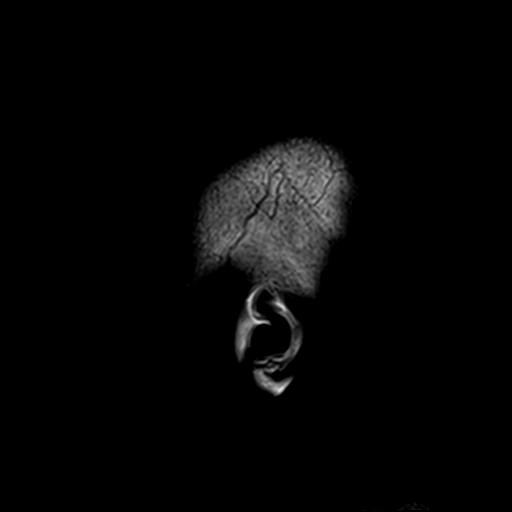

[Series 4: DWI · axial · 3.0mm · 1.80mm/px · z∈[-55,+91]mm · 11 of 98 slices shown (1 of 2)]
[im 1/98]
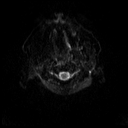
[im 10/98]
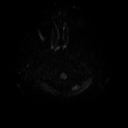
[im 20/98]
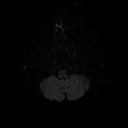
[im 30/98]
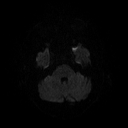
[im 39/98]
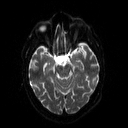
[im 49/98]
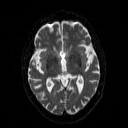
[im 59/98]
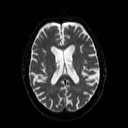
[im 68/98]
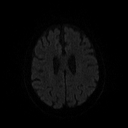
[im 78/98]
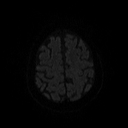
[im 88/98]
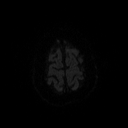
[im 98/98]
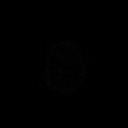

[Series 5: DWI · axial · 3.0mm · 1.80mm/px · z∈[-55,+91]mm · 6 of 49 slices shown (2 of 2)]
[im 1/49]
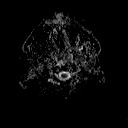
[im 10/49]
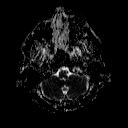
[im 20/49]
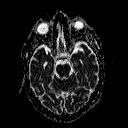
[im 29/49]
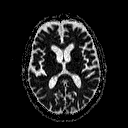
[im 39/49]
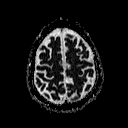
[im 49/49]
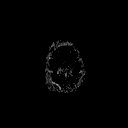

[Series 6: T2 · axial · 5.0mm · 0.51mm/px · z∈[-55,+91]mm · 2 of 22 slices shown (1 of 2)]
[im 1/22]
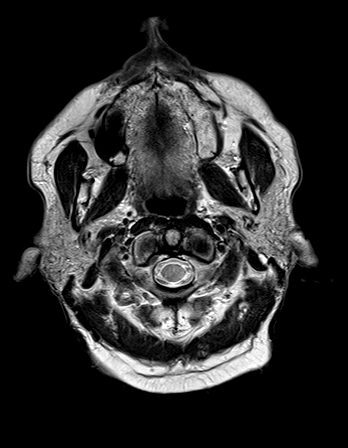
[im 22/22]
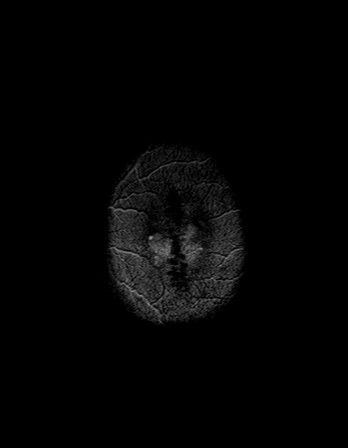

[Series 7: FLAIR · axial · 3.0mm · 0.45mm/px · z∈[-49,+85]mm · 3 of 30 slices shown]
[im 1/30]
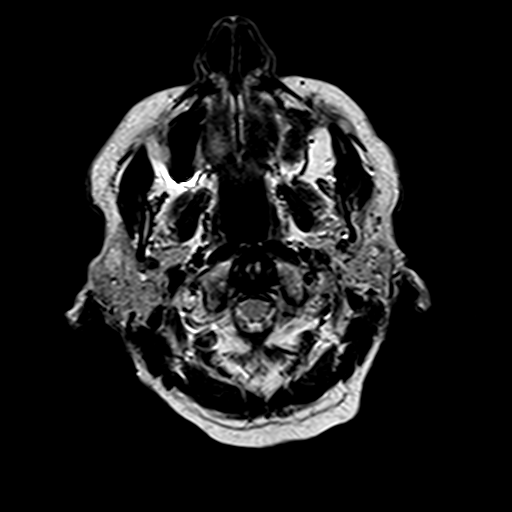
[im 15/30]
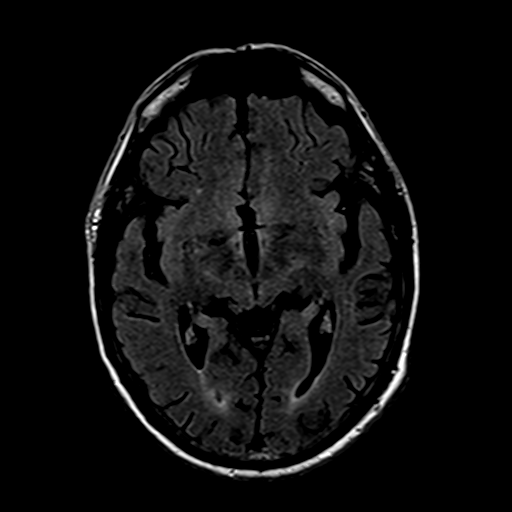
[im 30/30]
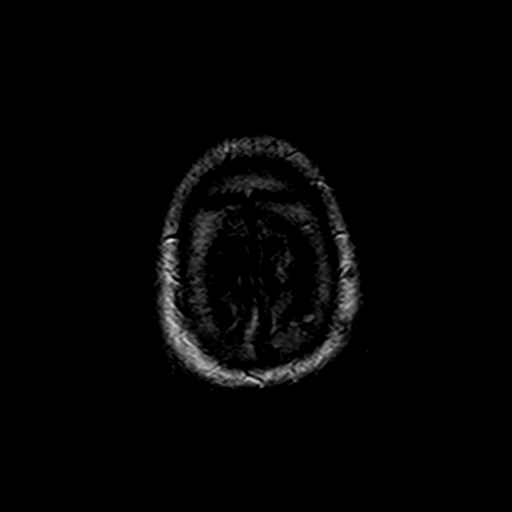

[Series 9: swi_images · axial · 4.0mm · 0.90mm/px · z∈[-51,+87]mm · 4 of 36 slices shown]
[im 1/36]
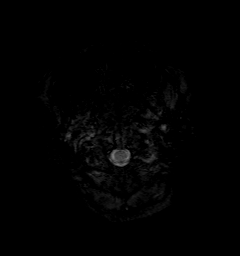
[im 12/36]
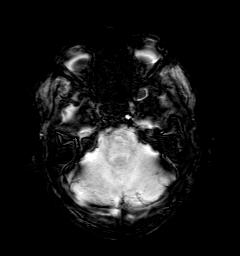
[im 24/36]
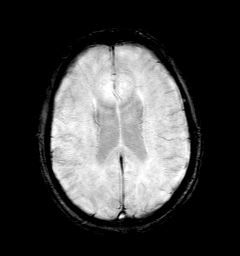
[im 36/36]
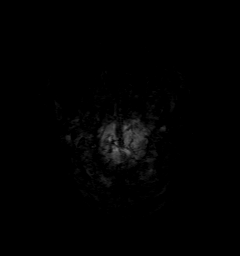

[Series 10: t1_mpr_tra · axial · 1.0mm · 0.71mm/px · z∈[-53,+89]mm · 16 of 144 slices shown]
[im 1/144]
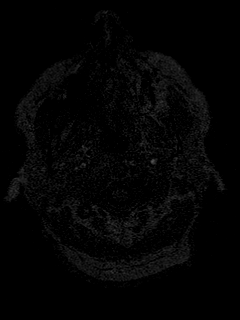
[im 10/144]
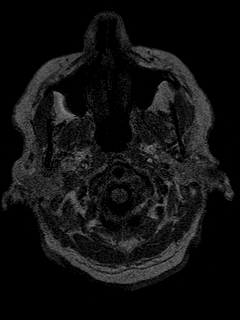
[im 20/144]
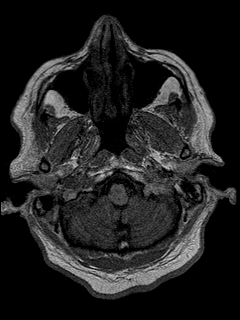
[im 29/144]
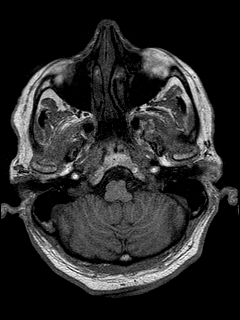
[im 39/144]
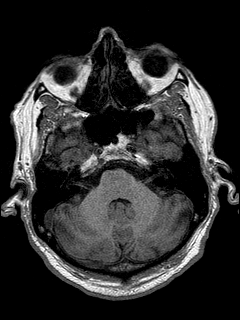
[im 48/144]
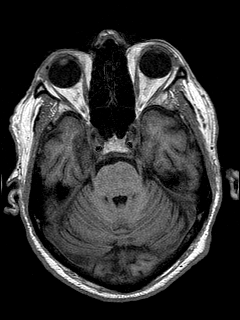
[im 58/144]
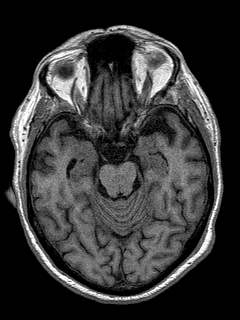
[im 67/144]
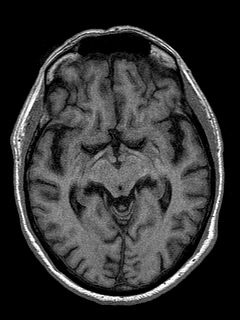
[im 77/144]
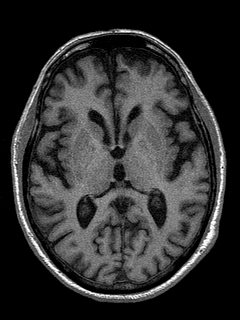
[im 86/144]
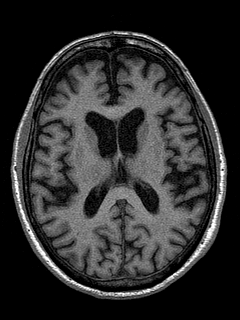
[im 96/144]
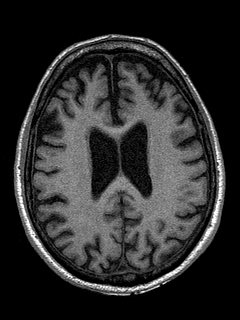
[im 105/144]
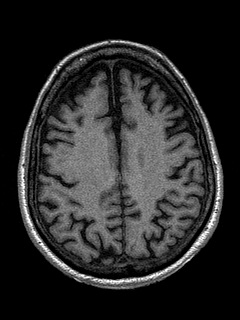
[im 115/144]
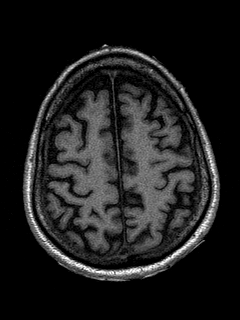
[im 124/144]
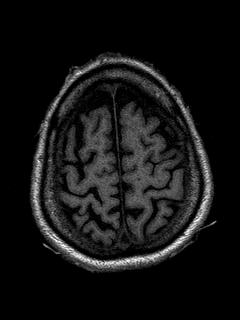
[im 134/144]
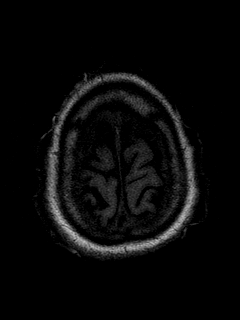
[im 144/144]
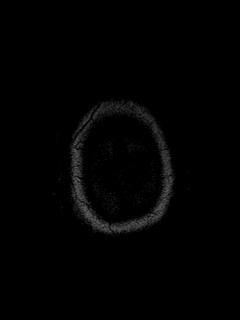

[Series 11: T2 · coronal · 5.0mm · 0.45mm/px · 3 of 25 slices shown (2 of 2)]
[im 1/25]
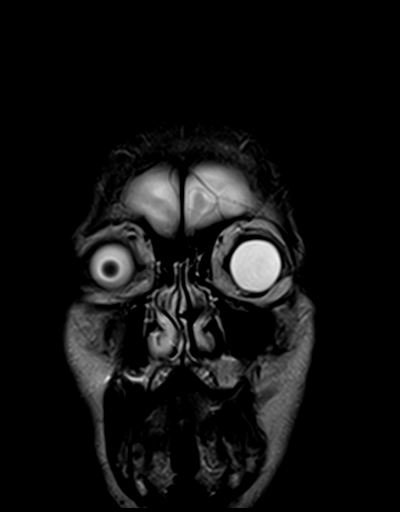
[im 13/25]
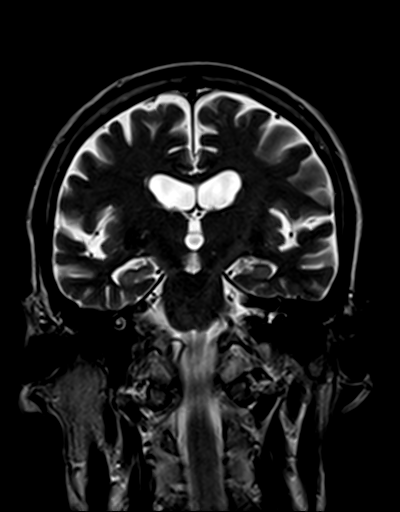
[im 25/25]
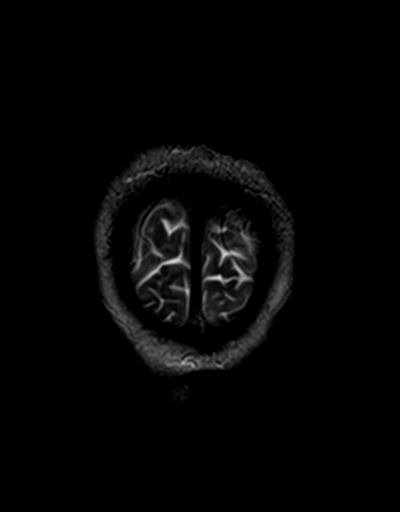

[48 of 48 positions shown; findings below may reference images not displayed]

FINDINGS: Brain: There is no evidence of an acute infarct, intracranial
hemorrhage, mass, midline shift, or extra-axial fluid collection.
Minimal T2 hyperintensities in the cerebral white matter bilaterally
are not considered abnormal for age. There is mild-to-moderate
cerebral atrophy without lobar predominance.

Vascular: Major intracranial vascular flow voids are preserved.

Skull and upper cervical spine: Unremarkable bone marrow signal.

Sinuses/Orbits: Unremarkable orbits. Paranasal sinuses and mastoid
air cells are clear.

Other: None.
IMPRESSION: 1. No acute intracranial abnormality.
2. Mild-to-moderate generalized cerebral atrophy.

## 2022-03-05 ENCOUNTER — Ambulatory Visit (INDEPENDENT_AMBULATORY_CARE_PROVIDER_SITE_OTHER): Payer: Medicare HMO | Admitting: Podiatry

## 2022-03-05 ENCOUNTER — Encounter: Payer: Self-pay | Admitting: Podiatry

## 2022-03-05 VITALS — BP 135/61

## 2022-03-05 DIAGNOSIS — E1065 Type 1 diabetes mellitus with hyperglycemia: Secondary | ICD-10-CM

## 2022-03-05 DIAGNOSIS — M79675 Pain in left toe(s): Secondary | ICD-10-CM

## 2022-03-05 DIAGNOSIS — B351 Tinea unguium: Secondary | ICD-10-CM

## 2022-03-05 DIAGNOSIS — E119 Type 2 diabetes mellitus without complications: Secondary | ICD-10-CM

## 2022-03-05 DIAGNOSIS — M79674 Pain in right toe(s): Secondary | ICD-10-CM

## 2022-03-05 NOTE — Progress Notes (Unsigned)
ANNUAL DIABETIC FOOT EXAM  Subjective: Rodney Miller presents today {jgcomplaint:23593}.  Chief Complaint  Patient presents with   Nail Problem    DFC BS-122 A1C-7.2 PCP-Reddy,Mahitha PCP VST-8 months ago    Patient confirms h/o diabetes.  Patient relates {Numbers; 0-100:15068} year h/o diabetes.  Patient denies any h/o foot wounds.  Patient has h/o foot ulcer of {jgPodToeLocator:23637}, which healed via help of ***.  Patient has h/o amputation(s): {jgamp:23617}.  Patient admits symptoms of foot numbness.   Patient admits symptoms of foot tingling.  Patient admits symptoms of burning in feet.  Patient admits symptoms of pins/needles sensation in feet.  Patient denies any numbness, tingling, burning, or pins/needle sensation in feet.  Patient has been diagnosed with neuropathy and it is managed with {JGNEUROPATHYMEDS:27053}.  Patient's blood sugar was *** mg/dl {Time; today/yesterday/ 2 days ago:19188}. Last known  HgA1c was ***%   Patient did not check blood glucose this morning.  Patient does not monitor blood glucose daily.  Risk factors: {jgriskfactors:24044}.  Rodney Sidle, MD is patient's PCP. Last visit was {Time; dates multiple:15870}***.  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 ([place]) 04/09/2018   Hypertension associated with diabetes ([place]) 04/09/2018   Mild cognitive impairment 04/09/2018   Type 1 diabetes mellitus with hyperglycemia ([place]) 01/30/2018   Hyperlipidemia 04/03/2017   Hypothyroidism 04/01/2017   Past Surgical History:  Procedure Laterality Date   CYST EXCISION Right     leg   IR URETERAL STENT LEFT NEW ACCESS W/O SEP NEPHROSTOMY CATH  12/07/2021   LITHOTRIPSY     NEPHROLITHOTOMY Left 12/07/2021   Procedure: FIRST STAGE LEFT NEPHROLITHOTOMY PERCUTANEOUS;  Surgeon: Irine Seal, MD;  Location: [facility];  Service: Urology;  Laterality: Left;   Current Outpatient Medications on File Prior to Visit  Medication Sig Dispense Refill   donepezil (ARICEPT) 5 MG tablet Take 5 mg by mouth daily.     atorvastatin (LIPITOR) 20 MG tablet Take 1 tablet (20 mg total) by mouth daily. 90 tablet 2   CELEXA 20 MG tablet Take 20 mg by mouth daily.     Continuous Blood Gluc Sensor (DEXCOM G6 SENSOR) MISC 1 Device by Does not apply route as directed. 12 each 3   Continuous Blood Gluc Transmit (DEXCOM G6 TRANSMITTER) MISC 1 Device by Does not apply route as directed. 1 each 3   insulin aspart (NOVOLOG) 100 UNIT/ML injection USE UP TO 75 UNITS DAILY VIA INSULIN PUMP AS DIRECTED (Patient taking differently: USE UP TO 70 UNITS DAILY VIA INSULIN PUMP AS DIRECTED) 10 mL 0   Insulin Infusion Pump (T:SLIM INSULIN PUMP) DEVI by Does not apply route.     levothyroxine (SYNTHROID) 75 MCG tablet TAKE ONE TABLET BY MOUTH DAILY BEFORE BREAKFAST 90 tablet 0   lisinopril (ZESTRIL) 20 MG tablet Take 20 mg by mouth at bedtime.     meloxicam (MOBIC) 7.5 MG tablet Take 1 tablet (7.5 mg total) by mouth daily. 30 tablet 1   Multiple Vitamin (MULTIVITAMIN WITH MINERALS) TABS tablet Take 1 tablet by mouth daily.     Multiple Vitamins-Minerals (VITAMIN D3  COMPLETE PO) Take 1 tablet by mouth daily.     traMADol (ULTRAM) 50 MG tablet TAKE 1 TABLET BY MOUTH EVERY 6 HOURS AS NEEDED 30 tablet 0   No current facility-administered medications on file prior to visit.    No Known Allergies Social History   Occupational History   Occupation: retired  Tobacco Use   Smoking status: Never   Smokeless tobacco: Never  Vaping Use   Vaping Use: Never used  Substance and Sexual Activity   Alcohol use: Not Currently    Drug use: Never   Sexual activity: Not on file   Family History  Problem Relation Age of Onset   Juvenile Diabetes Son    Juvenile Diabetes Grandchild    Prostate cancer Neg Hx    Handshoe cancer Neg Hx    Immunization History  Administered Date(s) Administered   Moderna Sars-Covid-2 Vaccination 07/30/2019, 09/01/2019   Pneumococcal Conjugate-13 09/29/2017   Pneumococcal Polysaccharide-23 10/01/2018     Review of Systems: Negative except as noted in the HPI.   Objective: Vitals:   03/05/22 1423  BP: (!) 156/71   Rodney Miller is a pleasant 71 y.o. male in NAD. AAO X 3.  Vascular Examination: {jgvascular:23595}  Dermatological Examination: {jgderm:23598}  Neurological Examination: {jgneuro:23601::"Protective sensation intact 5/5 intact bilaterally with 10g monofilament b/l.","Vibratory sensation intact b/l.","Proprioception intact bilaterally."}  Musculoskeletal Examination: {jgmsk:23600}  Footwear Assessment: Does the patient wear appropriate shoes? {Yes,No}. Does the patient need inserts/orthotics? {Yes,No}.  ADA Risk Categorization: Low Risk :  Patient has all of the following: Intact protective sensation No prior foot ulcer  No severe deformity Pedal pulses present  High Risk  Patient has one or more of the following: Loss of protective sensation Absent pedal pulses Severe Foot deformity History of foot ulcer  Assessment: No diagnosis found.   Plan: No orders of the defined types were placed in this encounter.   No orders of the defined types were placed in this encounter.   None  {jgplan:23602::"-Patient/POA to call should there be question/concern in the interim."} Return in about 3 months (around 06/04/2022).  Marzetta Board, DPM

## 2022-03-06 DIAGNOSIS — R809 Proteinuria, unspecified: Secondary | ICD-10-CM | POA: Diagnosis not present

## 2022-03-06 DIAGNOSIS — E1029 Type 1 diabetes mellitus with other diabetic kidney complication: Secondary | ICD-10-CM | POA: Diagnosis not present

## 2022-03-06 DIAGNOSIS — E1065 Type 1 diabetes mellitus with hyperglycemia: Secondary | ICD-10-CM | POA: Diagnosis not present

## 2022-03-06 DIAGNOSIS — E785 Hyperlipidemia, unspecified: Secondary | ICD-10-CM | POA: Diagnosis not present

## 2022-03-06 DIAGNOSIS — E1040 Type 1 diabetes mellitus with diabetic neuropathy, unspecified: Secondary | ICD-10-CM | POA: Diagnosis not present

## 2022-03-06 DIAGNOSIS — E039 Hypothyroidism, unspecified: Secondary | ICD-10-CM | POA: Diagnosis not present

## 2022-05-27 DIAGNOSIS — F330 Major depressive disorder, recurrent, mild: Secondary | ICD-10-CM | POA: Insufficient documentation

## 2022-06-25 ENCOUNTER — Ambulatory Visit: Payer: Medicare HMO | Admitting: Podiatry

## 2023-01-21 ENCOUNTER — Ambulatory Visit: Payer: Medicare HMO | Admitting: Podiatry

## 2023-01-21 ENCOUNTER — Encounter: Payer: Self-pay | Admitting: Podiatry

## 2023-01-21 DIAGNOSIS — B351 Tinea unguium: Secondary | ICD-10-CM

## 2023-01-21 DIAGNOSIS — M79675 Pain in left toe(s): Secondary | ICD-10-CM | POA: Diagnosis not present

## 2023-01-21 DIAGNOSIS — M79674 Pain in right toe(s): Secondary | ICD-10-CM

## 2023-01-21 DIAGNOSIS — E1065 Type 1 diabetes mellitus with hyperglycemia: Secondary | ICD-10-CM

## 2023-01-21 NOTE — Progress Notes (Signed)
  Subjective:  Patient ID: Rodney Miller, male    DOB: 1950-12-14,  MRN: [id number]  72 y.o. male presents preventative diabetic foot care and painful elongated mycotic toenails 1-5 bilaterally which are tender when wearing enclosed shoe gear. Pain is relieved with periodic professional debridement. He is accompanied by his wife on today's visit. Wife informs me their son, Rodney Miller (also my patient), passed away in 09-01-2022. Chief Complaint  Patient presents with   Diabetes    DFC BS - 340 A1C - UNDER 7 LVPCP - 12/2022   New problem(s): None   PCP is Rodney Olszewski, MD.  No Known Allergies  Review of Systems: Negative except as noted in the HPI.   Objective:  Rodney Miller is a pleasant 72 y.o. male WD, WN in NAD. AAO x 3.  Vascular Examination: Vascular status intact b/l with palpable pedal pulses. CFT immediate b/l. Pedal hair present. No edema. No pain with calf compression b/l. Skin temperature gradient WNL b/l. No varicosities noted. No cyanosis or clubbing noted.  Neurological Examination: Sensation grossly intact b/l with 10 gram monofilament. Vibratory sensation intact b/l.  Dermatological Examination: Pedal skin with normal turgor, texture and tone b/l. No open wounds nor interdigital macerations noted. Toenails 1-5 b/l thick, discolored, elongated with subungual debris and pain on dorsal palpation. No hyperkeratotic lesions noted b/l.   Musculoskeletal Examination: Muscle strength 5/5 to b/l LE.  No pain, crepitus noted b/l. No gross pedal deformities. Patient ambulates independently without assistive aids.   Radiographs: None  Lab Results  Component Value Date   HGBA1C 7.5 (H) 11/26/2021   Assessment:   1. Pain due to onychomycosis of toenails of both feet   2. Type 1 diabetes mellitus with hyperglycemia ([facility])    Plan:  -Patient was evaluated today. All questions/concerns addressed on today's visit. -Patient's family member present. All questions/concerns addressed on  today's visit. -Extended condolences to the family on the loss of their son, Rodney Miller. -Continue foot and shoe inspections daily. Monitor blood glucose per PCP/Endocrinologist's recommendations. -Continue supportive shoe gear daily. -Mycotic toenails 1-5 bilaterally were debrided in length and girth with sterile nail nippers and dremel without incident. -Patient/POA to call should there be question/concern in the interim.  Return in about 3 months (around 04/23/2023).  Freddie Breech, DPM

## 2023-02-03 ENCOUNTER — Ambulatory Visit: Payer: Medicare HMO | Admitting: Podiatry

## 2023-02-11 ENCOUNTER — Ambulatory Visit: Payer: Medicare HMO

## 2023-02-11 DIAGNOSIS — M2010 Hallux valgus (acquired), unspecified foot: Secondary | ICD-10-CM

## 2023-02-11 DIAGNOSIS — E1065 Type 1 diabetes mellitus with hyperglycemia: Secondary | ICD-10-CM

## 2023-02-11 NOTE — Progress Notes (Signed)
Patient presents to the office today for diabetic shoe and insole measuring.  Patient was measured with brannock device to determine size and width for 1 pair of extra depth shoes and foam casted for 3 pair of insoles.   Documentation of medical necessity will be sent to patient's treating diabetic doctor to verify and sign.   Patient's diabetic provider: Crista Curb MD  Shoes and insoles will be ordered at that time and patient will be notified for an appointment for fitting when they arrive.   Shoe size (per patient): 8 Brannock measurement: 20215 / [id number] Patient shoe selection- Shoe choice:   20215 / [id number] Shoe size ordered: 8 MD  Financials signed

## 2023-02-26 ENCOUNTER — Ambulatory Visit (INDEPENDENT_AMBULATORY_CARE_PROVIDER_SITE_OTHER): Payer: Medicare HMO

## 2023-02-26 ENCOUNTER — Ambulatory Visit: Payer: Medicare HMO | Admitting: Podiatry

## 2023-02-26 ENCOUNTER — Encounter: Payer: Self-pay | Admitting: Podiatry

## 2023-02-26 DIAGNOSIS — M778 Other enthesopathies, not elsewhere classified: Secondary | ICD-10-CM

## 2023-02-26 DIAGNOSIS — M7751 Other enthesopathy of right foot: Secondary | ICD-10-CM

## 2023-02-26 MED ORDER — TRIAMCINOLONE ACETONIDE 10 MG/ML IJ SUSP
10.0000 mg | Freq: Once | INTRAMUSCULAR | Status: AC
  Administered 2023-02-26: 10 mg via INTRA_ARTICULAR

## 2023-02-26 NOTE — Progress Notes (Signed)
Subjective:   Patient ID: Rodney Miller, male   DOB: 72 y.o.   MRN: [id number]   HPI Patient presents with pain in the right ankle states that it has been this way for several months does not remember injury   ROS      Objective:  Physical Exam  Neurovascular status intact with patient found to have discomfort mostly located sinus tarsi and into the lateral ankle gutter right     Assessment:  Sinus tarsitis most likely right with inflammation     Plan:  H&P reviewed at this point I reviewed x-ray I did sterile prep injected the sinus tarsi right 3 mg Kenalog 5 mg Xylocaine applied sterile dressing reappoint to recheck as needed  X-rays negative for signs of fracture appears to be soft tissue

## 2023-03-07 ENCOUNTER — Telehealth: Payer: Self-pay

## 2023-03-07 NOTE — Telephone Encounter (Signed)
LEFT VM TO SCHEDULE DIABETIC SHOE PICK UP .

## 2023-03-20 ENCOUNTER — Telehealth: Payer: Self-pay

## 2023-03-20 NOTE — Telephone Encounter (Signed)
 Called to resch. Appt tricia will be out of office.

## 2023-04-07 ENCOUNTER — Other Ambulatory Visit: Payer: Medicare HMO

## 2023-04-14 ENCOUNTER — Ambulatory Visit (INDEPENDENT_AMBULATORY_CARE_PROVIDER_SITE_OTHER): Payer: Medicare HMO

## 2023-04-14 DIAGNOSIS — M2010 Hallux valgus (acquired), unspecified foot: Secondary | ICD-10-CM

## 2023-04-14 DIAGNOSIS — E1065 Type 1 diabetes mellitus with hyperglycemia: Secondary | ICD-10-CM | POA: Diagnosis not present

## 2023-04-14 DIAGNOSIS — M2141 Flat foot [pes planus] (acquired), right foot: Secondary | ICD-10-CM

## 2023-04-14 DIAGNOSIS — M2142 Flat foot [pes planus] (acquired), left foot: Secondary | ICD-10-CM | POA: Diagnosis not present

## 2023-04-14 NOTE — Progress Notes (Signed)

## 2023-05-14 ENCOUNTER — Ambulatory Visit (INDEPENDENT_AMBULATORY_CARE_PROVIDER_SITE_OTHER): Payer: Medicare HMO | Admitting: Podiatry

## 2023-05-14 ENCOUNTER — Encounter: Payer: Self-pay | Admitting: Podiatry

## 2023-05-14 VITALS — Ht 66.0 in | Wt 170.0 lb

## 2023-05-14 DIAGNOSIS — M7662 Achilles tendinitis, left leg: Secondary | ICD-10-CM | POA: Diagnosis not present

## 2023-05-14 DIAGNOSIS — M79675 Pain in left toe(s): Secondary | ICD-10-CM | POA: Diagnosis not present

## 2023-05-14 DIAGNOSIS — M7661 Achilles tendinitis, right leg: Secondary | ICD-10-CM

## 2023-05-14 DIAGNOSIS — B351 Tinea unguium: Secondary | ICD-10-CM

## 2023-05-14 DIAGNOSIS — E119 Type 2 diabetes mellitus without complications: Secondary | ICD-10-CM

## 2023-05-14 DIAGNOSIS — M2010 Hallux valgus (acquired), unspecified foot: Secondary | ICD-10-CM | POA: Diagnosis not present

## 2023-05-14 DIAGNOSIS — E1065 Type 1 diabetes mellitus with hyperglycemia: Secondary | ICD-10-CM

## 2023-05-14 DIAGNOSIS — M79674 Pain in right toe(s): Secondary | ICD-10-CM | POA: Diagnosis not present

## 2023-05-16 ENCOUNTER — Encounter: Payer: Self-pay | Admitting: Podiatry

## 2023-05-16 NOTE — Progress Notes (Signed)
 ANNUAL DIABETIC FOOT EXAM  Subjective: Rodney Miller presents today for annual diabetic foot exam. He is accompanied by his wife on today's visit. Patient is c/o bilateral achilles tendon pain which started about two weeks ago.  Chief Complaint  Patient presents with   DFc    " I have some pain in my right heel that feels like its asleep and the right heel has pain, I am here for a nail trim, PCP is Dr, Lorrene Reid and seen 3 months,"   Patient confirms h/o diabetes.  Patient denies any h/o foot wounds.  Patient has been diagnosed with neuropathy.  Combs, Prince Solian, DO is patient's PCP.  Past Medical History:  Diagnosis Date   Diabetes mellitus without complication ([facility])    Hypertension    Hypothyroidism    Kidney stones    Patient Active Problem List   Diagnosis Date Noted   Recurrent major depressive episodes, mild ([facility]) 05/27/2022   Acute blood loss anemia 12/10/2021   Left renal stone 12/07/2021   Vitamin D deficiency 04/11/2020   B12 deficiency 12/27/2019   Type 1 diabetes mellitus with diabetic polyneuropathy ([facility]) 08/17/2018   Mass of right lower leg 04/09/2018   Dyslipidemia due to type 1 diabetes mellitus ([facility]) 04/09/2018   Hypertension associated with diabetes ([facility]) 04/09/2018   Mild cognitive impairment 04/09/2018   Type 1 diabetes mellitus with hyperglycemia ([facility]) 01/30/2018   Hyperlipidemia 04/03/2017   Hypothyroidism 04/01/2017   Past Surgical History:  Procedure Laterality Date   CYST EXCISION Right    leg   IR URETERAL STENT LEFT NEW ACCESS W/O SEP NEPHROSTOMY CATH  12/07/2021   LITHOTRIPSY     NEPHROLITHOTOMY Left 12/07/2021   Procedure: FIRST STAGE LEFT NEPHROLITHOTOMY PERCUTANEOUS;  Surgeon: Bjorn Pippin, MD;  Location: [facility];  Service: Urology;  Laterality: Left;   Current Outpatient Medications on File Prior to Visit  Medication Sig Dispense Refill   atorvastatin (LIPITOR) 20 MG tablet Take 1 tablet (20 mg total) by mouth daily. 90 tablet 2    CELEXA 20 MG tablet Take 20 mg by mouth daily.     Continuous Blood Gluc Sensor (DEXCOM G6 SENSOR) MISC 1 Device by Does not apply route as directed. 12 each 3   Continuous Blood Gluc Transmit (DEXCOM G6 TRANSMITTER) MISC 1 Device by Does not apply route as directed. 1 each 3   donepezil (ARICEPT) 5 MG tablet Take 5 mg by mouth daily.     insulin aspart (NOVOLOG) 100 UNIT/ML injection USE UP TO 75 UNITS DAILY VIA INSULIN PUMP AS DIRECTED (Patient taking differently: USE UP TO 70 UNITS DAILY VIA INSULIN PUMP AS DIRECTED) 10 mL 0   Insulin Infusion Pump (T:SLIM INSULIN PUMP) DEVI by Does not apply route.     levothyroxine (SYNTHROID) 75 MCG tablet TAKE ONE TABLET BY MOUTH DAILY BEFORE BREAKFAST 90 tablet 0   lisinopril (ZESTRIL) 20 MG tablet Take 20 mg by mouth at bedtime.     meloxicam (MOBIC) 7.5 MG tablet Take 1 tablet (7.5 mg total) by mouth daily. 30 tablet 1   Multiple Vitamin (MULTIVITAMIN WITH MINERALS) TABS tablet Take 1 tablet by mouth daily.     Multiple Vitamins-Minerals (VITAMIN D3 COMPLETE PO) Take 1 tablet by mouth daily.     traMADol (ULTRAM) 50 MG tablet TAKE 1 TABLET BY MOUTH EVERY 6 HOURS AS NEEDED 30 tablet 0   No current facility-administered medications on file prior to visit.    No Known Allergies Social History  Occupational History   Occupation: retired  Tobacco Use   Smoking status: Never   Smokeless tobacco: Never  Vaping Use   Vaping status: Never Used  Substance and Sexual Activity   Alcohol use: Not Currently   Drug use: Never   Sexual activity: Not on file   Family History  Problem Relation Age of Onset   Juvenile Diabetes Son    Juvenile Diabetes Grandchild    Prostate cancer Neg Hx    [place] cancer Neg Hx    Immunization History  Administered Date(s) Administered   Moderna Sars-Covid-2 Vaccination 07/30/2019, 09/01/2019   Pneumococcal Conjugate-13 09/29/2017   Pneumococcal Polysaccharide-23 10/01/2018     Review of Systems: Negative except as  noted in the HPI.   Objective: There were no vitals filed for this visit.  Rodney Miller is a pleasant 73 y.o. male in NAD. AAO X 3.  Diabetic foot exam was performed with the following findings:   Vascular Examination: Capillary refill time immediate b/l. Vascular status intact b/l with palpable pedal pulses. Pedal hair present b/l. No pain with calf compression b/l. Skin temperature gradient WNL b/l. No cyanosis or clubbing b/l. No ischemia or gangrene noted b/l.   Neurological Examination: Sensation grossly intact b/l with 10 gram monofilament. Vibratory sensation intact b/l.   Dermatological Examination: Pedal skin with normal turgor, texture and tone b/l.  No open wounds. No interdigital macerations.   Toenails 1-5 b/l thick, discolored, elongated with subungual debris and pain on dorsal palpation.   No corns, calluses nor porokeratotic lesions noted.  Musculoskeletal Examination: Muscle strength 5/5 to all lower extremity muscle groups bilaterally. Pain on palpation of posterior calcaneus at insertion of Achilles tendon b/l lower extremities.  No palpable defect to indicate tear. No erythema, no ecchymosis. HAV with bunion b/l.  Radiographs: None     Lab Results  Component Value Date   HGBA1C 7.5 (H) 11/26/2021   ADA Risk Categorization: Low Risk :  Patient has all of the following: Intact protective sensation No prior foot ulcer  No severe deformity Pedal pulses present  Assessment: 1. Pain due to onychomycosis of toenails of both feet   2. Acquired hallux valgus, unspecified laterality   3. Type 1 diabetes mellitus with hyperglycemia ([facility])   4. Achilles tendonitis, bilateral   5. Encounter for diabetic foot exam ([facility])     Plan: Diabetic foot examination performed today. All patient's and/or POA's questions/concerns addressed on today's visit. Toenails 1-5 debrided in length and girth without incident. Continue foot and shoe inspections daily. Monitor blood glucose  per PCP/Endocrinologist's recommendations. Continue soft, supportive shoe gear daily. Report any pedal injuries to medical professional. Call office if there are any questions/concerns. -Patient referred to Dr. Sharl Ma  for evaluation of achilles tendonitis bilaterally. -Patient/POA to call should there be question/concern in the interim. Return in about 4 months (around 09/11/2023).  Freddie Breech, DPM      [facility] LOCATION: [address] [place] [address]                   Office [phone]   [facility] LOCATION: [address] [place], [place] [address] Office [phone]

## 2023-05-27 ENCOUNTER — Encounter: Payer: Self-pay | Admitting: Podiatry

## 2023-05-27 ENCOUNTER — Ambulatory Visit: Payer: Medicare HMO | Admitting: Podiatry

## 2023-05-27 DIAGNOSIS — M7661 Achilles tendinitis, right leg: Secondary | ICD-10-CM | POA: Diagnosis not present

## 2023-05-27 MED ORDER — MELOXICAM 7.5 MG PO TABS: 7.5000 mg | ORAL_TABLET | Freq: Every day | ORAL | 1 refills | Status: DC

## 2023-05-27 NOTE — Patient Instructions (Signed)

## 2023-05-29 NOTE — Progress Notes (Signed)
  Subjective:  Patient ID: Rodney Miller, male    DOB: January 31, 1951,  MRN: [id number]  Chief Complaint  Patient presents with   Foot Pain    Patient states he is having pain in his right foot and when he steps down the achilles hurt him. Patient has nerve damage, patient is taking tylenol for pain.     73 y.o. male presents with the above complaint. History confirmed with patient.  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 Tendonitis, Achilles, right      Plan:  Patient was evaluated and treated and all questions answered.  Discussed the etiology and treatment options for Achilles tendinitis including stretching, formal physical therapy with an eccentric exercises therapy plan, supportive shoegears such as a running shoe or sneaker, heel lifts, topical and oral medications.  We also discussed that I do not routinely perform injections in this area because of the risk of an increased damage or rupture of the tendon.  We also discussed the role of surgical treatment of this for patients who do not improve after exhausting non-surgical treatment options.  -Educated on stretching and icing of the affected limb. -Rx for Meloxicam. Advised on risks, benefits, and alternatives of the medication -Recommended Voltaren gel use 3-4 times daily -Recommended home physical therapy plan and this was given.   Return in about 1 month (around 06/27/2023) for re-check Achilles tendon.

## 2023-06-17 ENCOUNTER — Ambulatory Visit: Attending: Geriatric Medicine

## 2023-06-17 ENCOUNTER — Other Ambulatory Visit: Payer: Self-pay

## 2023-06-17 DIAGNOSIS — F801 Expressive language disorder: Secondary | ICD-10-CM | POA: Insufficient documentation

## 2023-06-17 DIAGNOSIS — R41841 Cognitive communication deficit: Secondary | ICD-10-CM | POA: Diagnosis present

## 2023-06-17 NOTE — Therapy (Signed)
 OUTPATIENT SPEECH LANGUAGE PATHOLOGY EVALUATION   Patient Name: Rodney Miller MRN: [id number] DOB:Dec 17, 1950, 73 y.o., male Today's Date: 06/17/2023  PCP: Elesa Massed, DO REFERRING PROVIDER: same as PCP  END OF SESSION:  End of Session - 06/17/23 2041     Visit Number 1    Number of Visits 9   once/week recommended but pt and wife want to schedule every other week   Date for SLP Re-Evaluation 08/15/23    SLP Start Time 1150    SLP Stop Time  1232    SLP Time Calculation (min) 42 min    Activity Tolerance Patient tolerated treatment well             Past Medical History:  Diagnosis Date   Diabetes mellitus without complication (HCC)    Hypertension    Hypothyroidism    Kidney stones    Past Surgical History:  Procedure Laterality Date   CYST EXCISION Right    leg   IR URETERAL STENT LEFT NEW ACCESS W/O SEP NEPHROSTOMY CATH  12/07/2021   LITHOTRIPSY     NEPHROLITHOTOMY Left 12/07/2021   Procedure: FIRST STAGE LEFT NEPHROLITHOTOMY PERCUTANEOUS;  Surgeon: Bjorn Pippin, MD;  Location: [facility];  Service: Urology;  Laterality: Left;   Patient Active Problem List   Diagnosis Date Noted   Recurrent major depressive episodes, mild (HCC) 05/27/2022   Acute blood loss anemia 12/10/2021   Left renal stone 12/07/2021   Vitamin D deficiency 04/11/2020   B12 deficiency 12/27/2019   Type 1 diabetes mellitus with diabetic polyneuropathy (HCC) 08/17/2018   Mass of right lower leg 04/09/2018   Dyslipidemia due to type 1 diabetes mellitus (HCC) 04/09/2018   Hypertension associated with diabetes (HCC) 04/09/2018   Mild cognitive impairment 04/09/2018   Type 1 diabetes mellitus with hyperglycemia (HCC) 01/30/2018   Hyperlipidemia 04/03/2017   Hypothyroidism 04/01/2017    ONSET DATE: Script date  05/28/23; sx began 2018  REFERRING DIAG:  G30.1,F02.A3 (ICD-10-CM) - 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 Signs/sx of memory issues since 2018  PAIN:  Are you having pain? No  FALLS: Has patient fallen in last 6 months?  No  LIVING ENVIRONMENT: Lives with: lives with their spouse Lives in: House/apartment  PLOF:  Level of assistance: Independent with ADLs, Needed assistance with ADLs, Needed assistance with IADLS Employment: Retired  PATIENT GOALS: Pt did not indicate he had goals in today's session. Wife indicated sshe would like to see Rodney Miller recall things better.    OBJECTIVE:  Note: Objective measures were completed at Evaluation unless otherwise noted.  DIAGNOSTIC FINDINGS:  MRI HEAD WO CONTRAST April 2022 IMPRESSION: 1. No acute intracranial abnormality. 2. Mild-to-moderate generalized cerebral atrophy.  COGNITION: Overall cognitive status: Impaired and History of cognitive impairments - at baseline Areas of impairment:  Memory: Impaired: Immediate Short term Prospective Auditory Awareness: Impaired: Intellectual Executive function: Impaired: Initiation, Problem solving, Planning, and Slow processing Behavior: pragmatics: pt repeated statements to SLP today x2    Functional deficits: wife does all cooking, finances, appointment making and management, and 50%+ of medication administration. Pt still drives. See "treatment date" for details about suggestions made today to improve situations in which pt has difficulty  with memory.  COGNITIVE COMMUNICATION: Following directions: Follows multi-step commands with increased time  Auditory comprehension: Impaired: sometimes took pt extra time to answer SLP questions Verbal expression: Impaired: pt's thought pattern for verbal communication  was sometimes less-fluid than normal Functional communication: WFL with extra time allotted  ORAL MOTOR EXAMINATION: Overall status: WFL  STANDARDIZED ASSESSMENTS: [facility] Verbal Learning Test: RECALL: 3/12 (below WNL), 3/12 (below WNL) and 5/12 (below WNL). RECOGNITION: 6/12 (significantly below WNL) .  Results indicate more of a memory ENCODING deficit; SLP urged Rodney Miller to repeat things to Rodney Miller as well as have him repeat salient items for better recall.   PATIENT REPORTED OUTCOME MEASURES (PROM): Cognitive function: Short Form: to be administered first session                                                                                                                            TREATMENT DATE:   06/17/23: SLP shared with pt and wife that keeping the calendar in the living room where Rodney Miller spends most of his time, and keeping appointments on the calendar for him to be cued to look at when he asks about appointments. Rodney Miller is already crossing off past days and putting appointments on the calendar for pt but left calendar in pt's room. SLP suggested Rodney Miller set an alarm for Rodney Miller to be alerted when it 's time for his midday and bedtime medications, to improve medication adminstration accuracy. SLP suggested wife use a sticky note and place it by the door Rodney Miller uses to take the bins to the street. Lastly, pt stated he liked watching TV and SLP talked about the importance of physical exercise and doing more brain stimulation tasks such as playing games that require thinking, and ultimately reducing the time watching TV.  PATIENT EDUCATION: Education details: see "treatment date" for details Person educated: Patient and Spouse Education method: Explanation and Demonstration Education comprehension: verbalized understanding and needs further education   GOALS: Goals reviewed with patient? Yes in general  SHORT TERM GOALS: Target date: 07/18/23  Pt will look at calendar when inquiring  about appointments, when asked to do so, between 2 sessions Baseline: Goal status: INITIAL  2.  Pt will demo understanding of trash bins to be taken up to street using compensations (sticky note) Baseline:  Goal status: INITIAL  3.  Pt will indicate he understands that something is supposed to occur when he hears the alarm for medication Baseline:  Goal status: INITIAL   LONG TERM GOALS: Target date: 08/15/23  Pt will improve PROM score compared to initial administration Baseline:  Goal status: INITIAL  2.  Pt will demo understanding of trash bins to be taken up to street using compensations (sticky note) between two sessions Baseline:  Goal status: INITIAL  3.  Pt will indicate he understands that med administration is supposed to occur when he hears the alarm for medication, between 2 sepearate sessions Baseline:  Goal status: INITIAL  4.  Pt will look at calendar when curious about appointment times and dates, 25% of the time, independently Baseline:  Goal status: INITIAL  5.  To improve pt's opportunity for recall, Rodney Miller will demonstrate understanding of how to use spaced retrieval with pt Baseline:  Goal status: INITIAL   ASSESSMENT:  CLINICAL IMPRESSION: Patient is a 73 y.o. M who was seen today for assessment of memory skills in light of diagnosed mild early onset dementia. Pt has had memory sx reportedly since 2018. Today Shayde was pleasant when asked questions, and he offered a few comments without prompting/questioning.  SLP desired once/weed for 6-8 weeks but wife chose every other week for 4 visits. Frequency may increase later in plan of care.  Neuropsych testing may want to be pursued to quantify pt's deficits.  OBJECTIVE IMPAIRMENTS: include memory, awareness, executive functioning, and expressive language. These impairments are limiting patient from managing medications, managing appointments, managing finances, household responsibilities, ADLs/IADLs, and  effectively communicating at home and in community. Factors affecting potential to achieve goals and functional outcome are ability to learn/carryover information, cooperation/participation level, medical prognosis, and severity of impairments.. Patient will benefit from skilled SLP services to address above impairments and improve overall function.  REHAB POTENTIAL: Fair given above factors  PLAN:  SLP FREQUENCY: 1x/week  SLP DURATION: 8 weeks is what SLP recommended, but wife/pt prefer to attend every other week at this time with option to incr frequency  PLANNED INTERVENTIONS: Environmental controls, Cognitive reorganization, Internal/external aids, Functional tasks, Multimodal communication approach, SLP instruction and feedback, Compensatory strategies, Patient/family education, and [address] Treatment of speech (30 or 45 min)     Johnjoseph Rolfe, CCC-SLP 06/17/2023, 8:43 PM

## 2023-06-17 NOTE — Patient Instructions (Addendum)
  We talked about keeping the calendar in the living room where you spend most of your time, keeping appointments on the calendar for you to look at when you need to. We talked about having an alarm for you to be alerted when it 's time for your medication. Rodney Miller will set that- but listen for it! We talked about Rodney Miller using a sticky note and placing it by the door you go out to get the bins, so you can better recall which trash bins go to the street on any given Wednesday. We talked about the importance of physical exercise and doing more brain exercising things such as playing games that require thinking, and reducing the time watching TV

## 2023-07-01 ENCOUNTER — Telehealth: Payer: Self-pay

## 2023-07-01 ENCOUNTER — Ambulatory Visit: Admitting: Podiatry

## 2023-07-01 ENCOUNTER — Ambulatory Visit

## 2023-07-01 NOTE — Telephone Encounter (Signed)
 Speech Therapy - no show  SLP LM and did not provide any of pt's ID info due to generic outgoing VM message.  SLP LM about no show today and encouraged them to call for rescheduling. Told them if no call by pt's next appointment on 07/15/23 and a no show to that appointment on 07/15/23, we would d/c pt.

## 2023-07-08 ENCOUNTER — Ambulatory Visit: Admitting: Podiatry

## 2023-07-08 ENCOUNTER — Encounter: Payer: Self-pay | Admitting: Podiatry

## 2023-07-08 VITALS — Ht 66.0 in | Wt 170.0 lb

## 2023-07-08 DIAGNOSIS — M7661 Achilles tendinitis, right leg: Secondary | ICD-10-CM

## 2023-07-08 NOTE — Progress Notes (Signed)
  Subjective:  Patient ID: Rodney Miller, male    DOB: [birth year],  MRN: [id number]  Chief Complaint  Patient presents with   Foot Pain    Pt is here to f/u on right achilles tendonitis pt states the pain has gotten better, continues to take meloxcam as prescribe.    73 y.o. male presents with the above complaint. History confirmed with patient.    Objective:  Physical Exam: warm, good capillary refill, no trophic changes or ulcerative lesions, normal DP and PT pulses  Right Foot: No pain to palpation of the Achilles insertion no edema or heat, he does have mild tenderness with resisted plantarflexion   Radiographs: Multiple views x-ray of the right foot: Prior radiographs 02/26/2023 show posterior calcaneal spurring Assessment:   1. Tendonitis, Achilles, right      Plan:  Patient was evaluated and treated and all questions answered.  Improved quite a bit.  Continue physical therapy at home, he did not do the previous x-rays because they did not get the sheet at checkout.  It was dispensed again today.  He should take meloxicam  for 1 more month and then should be able discontinue.  Most of his discomfort and pain is neuropathic in nature due to his previous surgery as well as peripheral neuropathy.  I discussed with him these are unfortunate things that I can address for him and that he should continue to follow-up with his family doctor and a neurologist.   Return if symptoms worsen or fail to improve.

## 2023-07-08 NOTE — Patient Instructions (Signed)

## 2023-07-15 ENCOUNTER — Ambulatory Visit

## 2023-07-15 DIAGNOSIS — R41841 Cognitive communication deficit: Secondary | ICD-10-CM | POA: Diagnosis not present

## 2023-07-15 DIAGNOSIS — F801 Expressive language disorder: Secondary | ICD-10-CM

## 2023-07-15 NOTE — Therapy (Signed)
 OUTPATIENT SPEECH LANGUAGE PATHOLOGY EVALUATION   Patient Name: Rodney Miller MRN: [id number] DOB:1951-01-04, 73 y.o., male Today's Date: 07/15/2023  PCP: Rodney Bur, PA REFERRING PROVIDER: same as PCP  END OF SESSION:  End of Session - 07/15/23 1404     Visit Number 2    Number of Visits 9   once/week recommended but pt and wife want to schedule every other week   Date for SLP Re-Evaluation 08/15/23    SLP Start Time 1318    SLP Stop Time  1355    SLP Time Calculation (min) 37 min    Activity Tolerance Patient tolerated treatment well              Past Medical History:  Diagnosis Date   Diabetes mellitus without complication (HCC)    Hypertension    Hypothyroidism    Kidney stones    Past Surgical History:  Procedure Laterality Date   CYST EXCISION Right    leg   IR URETERAL STENT LEFT NEW ACCESS W/O SEP NEPHROSTOMY CATH  12/07/2021   LITHOTRIPSY     NEPHROLITHOTOMY Left 12/07/2021   Procedure: FIRST STAGE LEFT NEPHROLITHOTOMY PERCUTANEOUS;  Surgeon: Rodney Luster, MD;  Location: [facility];  Service: Urology;  Laterality: Left;   Patient Active Problem List   Diagnosis Date Noted   Recurrent major depressive episodes, mild (HCC) 05/27/2022   Acute blood loss anemia 12/10/2021   Left renal stone 12/07/2021   Vitamin D deficiency 04/11/2020   B12 deficiency 12/27/2019   Type 1 diabetes mellitus with diabetic polyneuropathy (HCC) 08/17/2018   Mass of right lower leg 04/09/2018   Dyslipidemia due to type 1 diabetes mellitus (HCC) 04/09/2018   Hypertension associated with diabetes (HCC) 04/09/2018   Mild cognitive impairment 04/09/2018   Type 1 diabetes mellitus with hyperglycemia (HCC) 01/30/2018   Hyperlipidemia 04/03/2017   Hypothyroidism 04/01/2017    ONSET DATE: Script date  05/28/23; sx began 2018  REFERRING DIAG:  G30.1,F02.A3 (ICD-10-CM) - 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  Pt accompanied by: significant other  PERTINENT HISTORY: type 1 DM, hypertension, hypothyroidism, hyperlipidemia. Signs/sx of memory issues since 2018  PAIN:  Are you having pain? No  FALLS: Has patient fallen in last 6 months?  No  PATIENT GOALS: Pt did not indicate he had goals in today's session. Wife indicated sshe would like to see Nail recall things better.    OBJECTIVE:  Note: Objective measures were completed at Evaluation unless otherwise noted.  DIAGNOSTIC FINDINGS:  MRI HEAD WO CONTRAST April 2022 IMPRESSION: 1. No acute intracranial abnormality. 2. Mild-to-moderate generalized cerebral atrophy.   PATIENT REPORTED OUTCOME MEASURES (PROM): Cognitive function: Short Form: tprovided 07/15/23 and asked to return next session.  TREATMENT DATE:   07/15/23: [name] states that [name] now does not ask what trash bins to take up each week due to the sticky note system working well.  With medication - have not put alarms in phone yet but SLP, wife, and pt problem solved and thought it best to remove pill boxes of days already past due to [name] not always knowing what day to take meds from. SLP suggested having the SAME ALARM and have the alarm be something environmental (dog bark, train whistle, etc) instead of a musical alarm.to make alarm more salient and less abstract. Calendar - pt still unaware of current day. SLP suggested [name] have [name] completely black out the previous day just before bed, then get up the next morning and look at the calendar somewhere in his morning routine. SLP told pt and wife that pt could also see about  appointments that day when looking at calendar in the AM. Wife will ask [name] to look at calendar when he asks about appointments  06/17/23: SLP shared with pt and wife that keeping the calendar in the living room where [name] spends most of his time, and keeping appointments on the calendar for him to be cued to look at when he asks about appointments. [name] is already crossing off past days and putting appointments on the calendar for pt but left calendar in pt's room. SLP suggested [name] set an alarm for [name] to be alerted when it 's time for his midday and bedtime medications, to improve medication adminstration accuracy. SLP suggested wife use a sticky note and place it by the door [name] uses to take the bins to the street. Lastly, pt stated he liked watching TV and SLP talked about the importance of physical exercise and doing more brain stimulation tasks such as playing games that require thinking, and ultimately reducing the time watching TV.  PATIENT EDUCATION: Education details: see "treatment date" for details Person educated: Patient and Spouse Education method: Explanation and Demonstration Education comprehension: verbalized understanding and needs further education   GOALS: Goals reviewed with patient? Yes in general  SHORT TERM GOALS: Target date: 07/18/23  Pt will look at calendar when inquiring about appointments, when asked to do so, between 2 sessions Baseline: Goal status: INITIAL  2.  Pt will demo understanding of trash bins to be taken up to street using compensations (sticky note) Baseline:  Goal status: met  3.  Pt will indicate he understands that something is supposed to occur when he hears the alarm for medication Baseline:  Goal status: INITIAL   LONG TERM GOALS: Target date: 08/15/23  Pt will improve PROM score compared to initial administration Baseline:  Goal status: INITIAL  2.  Pt will demo understanding of trash bins to be taken up to street using  compensations (sticky note) between two sessions Baseline:  Goal status: INITIAL  3.  Pt will indicate he understands that med administration is supposed to occur when he hears the alarm for medication, between 2 sepearate sessions Baseline:  Goal status: INITIAL  4.  Pt will look at calendar when curious about appointment times and dates, 25% of the time, independently Baseline:  Goal status: INITIAL  5.  To improve pt's opportunity for recall, Naomie Back will demonstrate understanding of how to use spaced retrieval with pt Baseline:  Goal status: INITIAL   ASSESSMENT:  CLINICAL IMPRESSION: Patient is a 73 y.o. M who was seen today for treatment of memory skills in light of diagnosed mild early  onset dementia. See "treatment date" above for today's date for further details on today's session. Pt has had memory sx reportedly since 2018. SLP desired once/weed for 6-8 weeks but wife chose every other week for 4 visits. Frequency may increase later in plan of care.  Neuropsych testing may want to be pursued to quantify pt's deficits.  OBJECTIVE IMPAIRMENTS: include memory, awareness, executive functioning, and expressive language. These impairments are limiting patient from managing medications, managing appointments, managing finances, household responsibilities, ADLs/IADLs, and effectively communicating at home and in community. Factors affecting potential to achieve goals and functional outcome are ability to learn/carryover information, cooperation/participation level, medical prognosis, and severity of impairments.. Patient will benefit from skilled SLP services to address above impairments and improve overall function.  REHAB POTENTIAL: Fair given above factors  PLAN:  SLP FREQUENCY: 1x/week  SLP DURATION: 8 weeks is what SLP recommended, but wife/pt prefer to attend every other week at this time with option to incr frequency  PLANNED INTERVENTIONS: Environmental controls,  Cognitive reorganization, Internal/external aids, Functional tasks, Multimodal communication approach, SLP instruction and feedback, Compensatory strategies, Patient/family education, and 40981 Treatment of speech (30 or 45 min)     Ceana Fiala, CCC-SLP 07/15/2023, 2:05 PM

## 2023-07-29 ENCOUNTER — Ambulatory Visit: Attending: Geriatric Medicine

## 2023-07-29 DIAGNOSIS — R41841 Cognitive communication deficit: Secondary | ICD-10-CM | POA: Insufficient documentation

## 2023-07-29 DIAGNOSIS — F801 Expressive language disorder: Secondary | ICD-10-CM | POA: Insufficient documentation

## 2023-07-29 NOTE — Therapy (Signed)
 OUTPATIENT SPEECH LANGUAGE PATHOLOGY EVALUATION   Patient Name: Rodney Miller MRN: [id number] DOB:[birth year], 73 y.o., male Today's Date: 07/29/2023  PCP: [name], PA REFERRING PROVIDER: same as PCP  END OF SESSION:  End of Session - 07/29/23 1321     Visit Number 3    Number of Visits 9   once/week recommended but pt and wife want to schedule every other week   Date for SLP Re-Evaluation 08/15/23    SLP Start Time 1321   checked in 1319   SLP Stop Time  1400    SLP Time Calculation (min) 39 min    Activity Tolerance Patient tolerated treatment well              Past Medical History:  Diagnosis Date   Diabetes mellitus without complication (HCC)    Hypertension    Hypothyroidism    Kidney stones    Past Surgical History:  Procedure Laterality Date   CYST EXCISION Right    leg   IR URETERAL STENT LEFT NEW ACCESS W/O SEP NEPHROSTOMY CATH  12/07/2021   LITHOTRIPSY     NEPHROLITHOTOMY Left 12/07/2021   Procedure: FIRST STAGE LEFT NEPHROLITHOTOMY PERCUTANEOUS;  Surgeon: Homero Luster, MD;  Location: [facility];  Service: Urology;  Laterality: Left;   Patient Active Problem List   Diagnosis Date Noted   Recurrent major depressive episodes, mild (HCC) 05/27/2022   Acute blood loss anemia 12/10/2021   Left renal stone 12/07/2021   Vitamin D deficiency 04/11/2020   B12 deficiency 12/27/2019   Type 1 diabetes mellitus with diabetic polyneuropathy (HCC) 08/17/2018   Mass of right lower leg 04/09/2018   Dyslipidemia due to type 1 diabetes mellitus (HCC) 04/09/2018   Hypertension associated with diabetes (HCC) 04/09/2018   Mild cognitive impairment 04/09/2018   Type 1 diabetes mellitus with hyperglycemia (HCC) 01/30/2018   Hyperlipidemia 04/03/2017   Hypothyroidism 04/01/2017    ONSET DATE: Script date  05/28/23; sx began 2018  REFERRING DIAG:  G30.1,F02.A3 (ICD-10-CM) - 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   Pt accompanied by: significant other  PERTINENT HISTORY: type 1 DM, hypertension, hypothyroidism, hyperlipidemia. Signs/sx of memory issues since 2018  PAIN:  Are you having pain? No  FALLS: Has patient fallen in last 6 months?  No  PATIENT GOALS: Pt did not indicate he had goals in today's session. Wife indicated sshe would like to see Elier recall things better.    OBJECTIVE:  Note: Objective measures were completed at Evaluation unless otherwise noted.  DIAGNOSTIC FINDINGS:  MRI HEAD WO CONTRAST April 2022 IMPRESSION: 1. No acute intracranial abnormality. 2. Mild-to-moderate generalized cerebral atrophy.   PATIENT REPORTED OUTCOME MEASURES (PROM): Cognitive function: Short Form: returned 07/29/23 and 26/40, with lower numbers indicating a more negative affect on pt's QOL. [name] scored Jencarlos 16/40.  TREATMENT DATE:   07/29/23: SLP educated pt/wife on air tag for his iPhone, given pt's "s". Going well with removing pill boxes for days past and is better with pt taking meds correctly and regularly. They just found pt's phone yesterday so they will begin using alarms for medication times. SLP provided total A for navigating pt to "clock" app to set alarm which req'd rare min A to do. SLP educated [name] and pt about spaced retrieval. She demonstrated she understood and might modify the critera to perform over several days instead of just once - due to other factors like blood sugar levels.   07/15/23: [name] states that Maliq now does not ask what trash bins to take up each week due to the sticky note system working well.  With medication - have not put alarms in phone yet but  SLP, wife, and pt problem solved and thought it best to remove pill boxes of days already past due to [place] not always knowing what day to take meds from. SLP suggested having the SAME ALARM and have the alarm be something environmental (dog bark, train whistle, etc) instead of a musical alarm.to make alarm more salient and less abstract. Calendar - pt still unaware of current day. SLP suggested [name] have Bransen completely black out the previous day just before bed, then get up the next morning and look at the calendar somewhere in his morning routine. SLP told pt and wife that pt could also see about appointments that day when looking at calendar in the AM. Wife will ask Tramond to look at calendar when he asks about appointments  06/17/23: SLP shared with pt and wife that keeping the calendar in the living room where Satish spends most of his time, and keeping appointments on the calendar for him to be cued to look at when he asks about appointments. [name] is already crossing off past days and putting appointments on the calendar for pt but left calendar in pt's room. SLP suggested [name] set an alarm for Grayer to be alerted when it 's time for his midday and bedtime medications, to improve medication adminstration accuracy. SLP suggested wife use a sticky note and place it by the door Jalan uses to take the bins to the street. Lastly, pt stated he liked watching TV and SLP talked about the importance of physical exercise and doing more brain stimulation tasks such as playing games that require thinking, and ultimately reducing the time watching TV.  PATIENT EDUCATION: Education details: see "treatment date" for details Person educated: Patient and Spouse Education method: Explanation and Demonstration Education comprehension: verbalized understanding and needs further education   GOALS: Goals reviewed with patient? Yes in general  SHORT TERM GOALS: Target date: 07/18/23  Pt will look at calendar when  inquiring about appointments, when asked to do so, between 2 sessions Baseline: 07/29/23 Goal status: partially met  2.  Pt will demo understanding of trash bins to be taken up to street using compensations (sticky note) Baseline:  Goal status: met  3.  Pt will indicate he understands that something is supposed to occur when he hears the alarm for medication Baseline:  Goal status: Not met due to pt losing phone for 2 weeks   LONG TERM GOALS: Target date: 08/15/23  Pt will improve PROM score compared to initial administration Baseline:  Goal status: INITIAL  2.  Pt will demo understanding of trash bins to be taken up to street using compensations (sticky note) between two  sessions Baseline: 07/29/23 Goal status: INITIAL  3.  Pt will indicate he understands that med administration is supposed to occur when he hears the alarm for medication, between 2 sepearate sessions Baseline:  Goal status: INITIAL  4.  Pt will look at calendar when curious about appointment times and dates, 25% of the time, independently Baseline:  Goal status: INITIAL  5.  To improve pt's opportunity for recall, Naomie Back will demonstrate understanding of how to use spaced retrieval with pt Baseline:  Goal status: INITIAL   ASSESSMENT:  CLINICAL IMPRESSION: Patient is a 73 y.o. M who was seen today for treatment of memory skills in light of diagnosed mild early onset dementia. See "treatment date" above for today's date for further details on today's session. Pt has had memory sx reportedly since 2018. SLP desired once/weed for 6-8 weeks but wife chose every other week for 4 visits. Frequency may increase later in plan of care.  Neuropsych testing may want to be pursued to quantify pt's deficits.  OBJECTIVE IMPAIRMENTS: include memory, awareness, executive functioning, and expressive language. These impairments are limiting patient from managing medications, managing appointments, managing finances, household  responsibilities, ADLs/IADLs, and effectively communicating at home and in community. Factors affecting potential to achieve goals and functional outcome are ability to learn/carryover information, cooperation/participation level, medical prognosis, and severity of impairments.. Patient will benefit from skilled SLP services to address above impairments and improve overall function.  REHAB POTENTIAL: Fair given above factors  PLAN:  SLP FREQUENCY: 1x/week  SLP DURATION: 8 weeks is what SLP recommended, but wife/pt prefer to attend every other week at this time with option to incr frequency  PLANNED INTERVENTIONS: Environmental controls, Cognitive reorganization, Internal/external aids, Functional tasks, Multimodal communication approach, SLP instruction and feedback, Compensatory strategies, Patient/family education, and [address] Treatment of speech (30 or 45 min)     Kieli Golladay, CCC-SLP 07/29/2023, 1:22 PM

## 2023-07-29 NOTE — Patient Instructions (Signed)
   How To Do Spaced Retrieval Training  Choose one or more functional targets or goals based upon the person's difficulty retrieving information (eg. remembering facts such a name, remembering to perform a certain action, remembering future activities, etc).  Ask a question to elicit the target response. If the answer is unknown or incorrect, tell or show them the right answer and have them repeat it back.  Ask again 15 seconds later. If they can't recall, give the answer and have them repeat it back. Try again in 15 seconds. If it's still not right, spaced retrieval may not be appropriate.  When the answer is given correctly, double the time interval (30 seconds, 1 minute, 2 minutes, 4 minutes, 8 minutes, etc.) and ask the question again. Repeat this step each time the answer is correctly given.  If the answer is incorrect, give the right answer immediately and ask the question again at the last correct time interval.

## 2023-08-07 ENCOUNTER — Telehealth: Payer: Self-pay

## 2023-08-07 NOTE — Telephone Encounter (Signed)
 Patient dropped off shoes while I was off stating they are damaged upon looking at shoe one is chewed up on the counter looking like an animal chewed up and or it got caught on something that frayed it. This is not something that is covered [employer] warranty I LM for paitent and spouse explaining this. I also let them know cost for new pair of shoes and that this pair will be in cupboard in the Lab and they can PU anytime  Britton Cane Cped, CFo, CFm

## 2023-08-12 ENCOUNTER — Ambulatory Visit

## 2023-09-10 NOTE — Progress Notes (Signed)
 Impression   1. Type 1 diabetes mellitus with microalbuminuria (*)      2. Acquired hypothyroidism  TSH   TSH   TSH    3. Memory loss      4. Recurrent major depressive episodes, mild      5. OSA (obstructive sleep apnea)  Ambulatory referral to Pulmonology    6. Anemia, unspecified type  CBC And Differential   Fe+TIBC+Fer   Fe+TIBC+Fer   CBC And Differential    7. Screening for prostate cancer  PSA    8. Benign prostatic hyperplasia, unspecified whether lower urinary tract symptoms present  PSA      Assessment & Plan  Plan   Type 1 diabetes stable not to goal recently keep follow-up with endocrinology diet low in sugar and carbs.  Continue with NovoLog  via insulin  pump and bolus based on carb ratio   Hypothyroidism -recent TSH too low recheck today and if still out of range decrease dosage down to levothyroxine  50 mcg daily and plan to recheck TSH in about 6 weeks  Ongoing fatigue did discuss with the wife and patient today this could be related to thyroid  level being out of range not very likely due to slightly low hemoglobin level with mild anemia.  More likely due to decline in memory and/or also the fact that he has apparently been diagnosed with sleep apnea but is not currently actually being treated  Obstructive sleep apnea-per wife's report this was done at a office since-down we will put in a new referral it has been over 6 months likely needs to update sleep study sounds like it was done at home may need to have a in facility test for appropriate titration of CPAP etc.  Memory loss-last visit started Namenda so far tolerating well continues medication and gave her the number of the neurology referral so she can call and set up a visit since she has not heard back from them  Vitamin D deficiency recently vitamin D well-controlled on blood work a month ago  Anemia recheck CBC last visit B12 was actually on the high end she will cut back to giving him oral supplement  just a few days a week.  And we will check iron levels to evaluate further advise multivitamin daily  Depression stable with Cymbalta  BPH screening for prostate cancer check PSA continue Flomax  Neuropathy right lower extremity continue with gabapentin    Health Maintenance Due  Topic Date Due  . DTaP/Tdap/Td Vaccines (1 - Tdap) Never done  . Zoster Vaccine (1 of 2) Never done  . RSV Adult and Pregnancy (1 - Risk 60-74 years 1-dose series) Never done  . Medicare Annual Wellness  01/20/2019  . COVID-19 Vaccine (3 - 2024-25 season) 11/17/2022  . Diabetes Annual Microalbumin/Creatinine Ratio  10/07/2023    See encounter after visit summary for patient specific instructions.  Follow up in about 6 weeks (around 10/22/2023), or MAW 40 mins.   Orders Placed This Encounter  Procedures  . TSH  . CBC And Differential  . TSH  . Fe+TIBC+Fer  . PSA  . Ambulatory referral to Pulmonology     Patient's Medications       * Accurate as of September 10, 2023  2:39 PM. Reflects encounter med changes as of last refresh          Continued Medications      Instructions  DEXCOM G7 SENSOR Misc  1 each, Does not apply, Every 10 days  DULoxetine HCl 60 mg capsule Commonly known as: CYMBALTA  60 mg, Daily   gabapentin 300 mg capsule Commonly known as: NEURONTIN  300 mg, Oral, 3 times a day   GVOKE HYPOPEN 1-PACK 1 MG/0.2ML Soaj Generic drug: Glucagon  1 mg, Subcutaneous, As needed   insulin  aspart (NOVOLOG ) 100 Unit/mL injection Commonly known as: NOVOLOG   INJECT 90 UNITS DAILY; TANDEM INSULIN  PUMP; BASAL 0.6; CARB RATIO 1:15; CORRECTION FACTOR:50 AS DIRECTED BY YOUR DOCTOR   levothyroxine  sodium 75 mcg tablet Commonly known as: SYNTHROID ,LEVOTHROID,LEVOXYL   75 mcg, Oral, Daily   lidocaine  5% Commonly known as: LIDODERM   lidocaine  topical 5% medicated adhesive patches   Melatonin 10 MG Caps  Take by mouth.   memantine 5 mg tablet Commonly known as: NAMENDA  5 mg, Oral,  At bedtime   Multi Vitamin/Minerals Tabs  1 tablet, Oral, Daily   ONETOUCH VERIO test strip Generic drug: glucose blood  SMARTSIG:Via Meter   tamsulosin 0.4 mg Caps Commonly known as: FLOMAX  0.4 mg, Daily   triamcinolone  acetonide 0.1% cream Commonly known as: KENALOG   triamcinolone  acetonide topical 0.1% cream   Vitamin D-3 125 MCG (5000 UT) Tabs  Take by mouth.       Modified Medications      Instructions  atorvastatin  calcium  10 mg tablet Commonly known as: LIPITOR What changed: Another medication with the same name was removed. Continue taking this medication, and follow the directions you see here. Changed by: Comer Reid  10 mg, At bedtime   lisinopril  20 mg tablet Commonly known as: PRINIVIL ,ZESTRIL  What changed: Another medication with the same name was removed. Continue taking this medication, and follow the directions you see here. Changed by: Katherine Hemberg  20 mg, At bedtime        Risks, benefits, and alternatives of the medications and treatment plan prescribed today were discussed, and patient expressed understanding. Plan follow-up as discussed or as needed if any worsening symptoms or change in condition.    A yearly health maintenance exam was recommended where appropriate.     Subjective   Patient ID:  Rodney Miller is a 73 y.o. (DOB September 15, 1950) male.   Chief Complaint  Patient presents with  . Follow-up     History of Present Illness   Results    Comes in for a 4-week follow-up from our last visit where we addressed quite a bit.  His wife comes in with him today he has progressive issues with change in cognition and memory loss over the last 6 months to a year.  Last visit we started Namenda she reports he is tolerating it well taking it at bedtime.  Next Follow-up for type 1 diabetes which last visit A1c in the 8% range followed by endocrinology has insulin  pump  Follow-up for blood work last visit showing low TSH level as  well as a mild anemia hemoglobin in the 12 range B12 level was on the high end.  She is currently giving him B12 supplement daily She reports she is concerned because he is extremely fatigued he gets tired just walking from 1 side of the house to the next.  She is concerned it has to do with anemia.  She reports he was tested for sleep apnea over 6 months ago at the place [facility]  She says the test was done at home and he was diagnosed with sleep apnea but then never started using the CPAP because he was concerned he would not be able to  tolerate it.  They felt like his fatigue was more likely from something else going on next Of note she has not heard back from referral from neurologist  Problem List, Past Medical History, Past Surgical History, Past Family History, Social History, Medications, and Allergies, were reviewed and updated as appropriate.   Review of Systems Review of Systems  Constitutional:  Negative for activity change, appetite change, fatigue and unexpected weight change.  Eyes:  Negative for visual disturbance.  Respiratory:  Negative for cough and shortness of breath.   Cardiovascular:  Negative for chest pain, palpitations and leg swelling.  Gastrointestinal:  Negative for abdominal pain.  Genitourinary:  Negative for dysuria.  Skin:  Negative for color change, rash and wound.  Neurological:  Negative for headaches.  Psychiatric/Behavioral:  Negative for dysphoric mood.      Objective   BP 130/66 (BP Location: Right Upper Arm, Patient Position: Sitting)   Pulse 66   Temp 98.6 F (37 C) (Temporal)   Resp 18   Ht 5' 3.5 (1.613 m)   Wt 160 lb (72.6 kg)   SpO2 98%   BMI 27.90 kg/m  Physical Exam  Physical Exam Constitutional:      Appearance: He is well-developed.  Neck:     Vascular: No carotid bruit.  Cardiovascular:     Rate and Rhythm: Normal rate and regular rhythm.  Musculoskeletal:     Cervical back: Normal range of motion and neck  supple.  Pulmonary:     Effort: Pulmonary effort is normal.     Breath sounds: Normal breath sounds.  Abdominal:     General: Bowel sounds are normal.     Palpations: Abdomen is soft.  Skin:    General: Skin is warm and dry.  Neurological:     Mental Status: He is alert and oriented to person, place, and time.  Psychiatric:        Mood and Affect: Mood normal.     --------------------------- Labs last 12 hours No results found for this or any previous visit (from the past 2 weeks).  Attestation   *Some images could not be shown.

## 2023-09-16 ENCOUNTER — Ambulatory Visit (INDEPENDENT_AMBULATORY_CARE_PROVIDER_SITE_OTHER): Payer: Medicare HMO | Admitting: Podiatry

## 2023-09-16 DIAGNOSIS — Z91198 Patient's noncompliance with other medical treatment and regimen for other reason: Secondary | ICD-10-CM

## 2023-09-16 NOTE — Progress Notes (Signed)
 1. Failure to attend appointment with reason given    Appointment canceled by patient/POA.

## 2023-09-22 ENCOUNTER — Emergency Department (HOSPITAL_BASED_OUTPATIENT_CLINIC_OR_DEPARTMENT_OTHER)

## 2023-09-22 ENCOUNTER — Observation Stay (HOSPITAL_BASED_OUTPATIENT_CLINIC_OR_DEPARTMENT_OTHER)
Admission: EM | Admit: 2023-09-22 | Discharge: 2023-09-23 | Disposition: A | Discharge status: Home / Self Care | Attending: Internal Medicine | Admitting: Internal Medicine

## 2023-09-22 ENCOUNTER — Other Ambulatory Visit: Payer: Self-pay

## 2023-09-22 ENCOUNTER — Inpatient Hospital Stay (HOSPITAL_COMMUNITY)

## 2023-09-22 DIAGNOSIS — G9340 Encephalopathy, unspecified: Secondary | ICD-10-CM | POA: Insufficient documentation

## 2023-09-22 DIAGNOSIS — E860 Dehydration: Secondary | ICD-10-CM | POA: Diagnosis not present

## 2023-09-22 DIAGNOSIS — E1010 Type 1 diabetes mellitus with ketoacidosis without coma: Principal | ICD-10-CM | POA: Insufficient documentation

## 2023-09-22 DIAGNOSIS — E039 Hypothyroidism, unspecified: Secondary | ICD-10-CM | POA: Insufficient documentation

## 2023-09-22 DIAGNOSIS — E785 Hyperlipidemia, unspecified: Secondary | ICD-10-CM | POA: Diagnosis not present

## 2023-09-22 DIAGNOSIS — E875 Hyperkalemia: Secondary | ICD-10-CM | POA: Diagnosis not present

## 2023-09-22 DIAGNOSIS — N179 Acute kidney failure, unspecified: Secondary | ICD-10-CM | POA: Diagnosis not present

## 2023-09-22 DIAGNOSIS — E1065 Type 1 diabetes mellitus with hyperglycemia: Secondary | ICD-10-CM | POA: Diagnosis not present

## 2023-09-22 DIAGNOSIS — Z794 Long term (current) use of insulin: Secondary | ICD-10-CM | POA: Insufficient documentation

## 2023-09-22 DIAGNOSIS — N400 Enlarged prostate without lower urinary tract symptoms: Secondary | ICD-10-CM | POA: Diagnosis not present

## 2023-09-22 DIAGNOSIS — D72829 Elevated white blood cell count, unspecified: Secondary | ICD-10-CM | POA: Insufficient documentation

## 2023-09-22 DIAGNOSIS — R739 Hyperglycemia, unspecified: Secondary | ICD-10-CM | POA: Diagnosis present

## 2023-09-22 DIAGNOSIS — E1110 Type 2 diabetes mellitus with ketoacidosis without coma: Principal | ICD-10-CM | POA: Diagnosis present

## 2023-09-22 DIAGNOSIS — I10 Essential (primary) hypertension: Secondary | ICD-10-CM | POA: Insufficient documentation

## 2023-09-22 DIAGNOSIS — G9341 Metabolic encephalopathy: Secondary | ICD-10-CM | POA: Diagnosis not present

## 2023-09-22 DIAGNOSIS — F0390 Unspecified dementia without behavioral disturbance: Secondary | ICD-10-CM | POA: Insufficient documentation

## 2023-09-22 DIAGNOSIS — E1159 Type 2 diabetes mellitus with other circulatory complications: Secondary | ICD-10-CM | POA: Diagnosis present

## 2023-09-22 LAB — COMPREHENSIVE METABOLIC PANEL WITH GFR
ALT: 21 U/L (ref 0–44)
AST: 18 U/L (ref 15–41)
Albumin: 4.2 g/dL (ref 3.5–5.0)
Alkaline Phosphatase: 88 U/L (ref 38–126)
Anion gap: 29 — ABNORMAL HIGH (ref 5–15)
BUN: 42 mg/dL — ABNORMAL HIGH (ref 8–23)
CO2: 10 mmol/L — ABNORMAL LOW (ref 22–32)
Calcium: 9.9 mg/dL (ref 8.9–10.3)
Chloride: 92 mmol/L — ABNORMAL LOW (ref 98–111)
Creatinine, Ser: 1.81 mg/dL — ABNORMAL HIGH (ref 0.61–1.24)
GFR, Estimated: 39 mL/min — ABNORMAL LOW (ref >60)
Glucose, Bld: 619 mg/dL (ref 70–99)
Potassium: 6.4 mmol/L (ref 3.5–5.1)
Sodium: 132 mmol/L — ABNORMAL LOW (ref 135–145)
Total Bilirubin: 0.7 mg/dL (ref 0.0–1.2)
Total Protein: 7.1 g/dL (ref 6.5–8.1)

## 2023-09-22 LAB — CBC WITH DIFFERENTIAL/PLATELET
Abs Immature Granulocytes: 0.09 K/uL — ABNORMAL HIGH (ref 0.00–0.07)
Basophils Absolute: 0 K/uL (ref 0.0–0.1)
Basophils Relative: 0 %
Eosinophils Absolute: 0 K/uL (ref 0.0–0.5)
Eosinophils Relative: 0 %
HCT: 36.2 % — ABNORMAL LOW (ref 39.0–52.0)
Hemoglobin: 11.9 g/dL — ABNORMAL LOW (ref 13.0–17.0)
Immature Granulocytes: 1 %
Lymphocytes Relative: 7 %
Lymphs Abs: 1.1 K/uL (ref 0.7–4.0)
MCH: 32.7 pg (ref 26.0–34.0)
MCHC: 32.9 g/dL (ref 30.0–36.0)
MCV: 99.5 fL (ref 80.0–100.0)
Monocytes Absolute: 0.9 K/uL (ref 0.1–1.0)
Monocytes Relative: 6 %
Neutro Abs: 13.4 K/uL — ABNORMAL HIGH (ref 1.7–7.7)
Neutrophils Relative %: 86 %
Platelets: 304 K/uL (ref 150–400)
RBC: 3.64 MIL/uL — ABNORMAL LOW (ref 4.22–5.81)
RDW: 12.3 % (ref 11.5–15.5)
WBC: 15.5 K/uL — ABNORMAL HIGH (ref 4.0–10.5)
nRBC: 0 % (ref 0.0–0.2)

## 2023-09-22 LAB — URINALYSIS, ROUTINE W REFLEX MICROSCOPIC
Bacteria, UA: NONE SEEN
Bilirubin Urine: NEGATIVE
Glucose, UA: >1000 mg/dL — AB
Hgb urine dipstick: NEGATIVE
Ketones, ur: 40 mg/dL — AB
Leukocytes,Ua: NEGATIVE
Nitrite: NEGATIVE
Protein, ur: NEGATIVE mg/dL
Specific Gravity, Urine: 1.019 (ref 1.005–1.030)
pH: 5 (ref 5.0–8.0)

## 2023-09-22 LAB — BASIC METABOLIC PANEL WITH GFR
Anion gap: 20 — ABNORMAL HIGH (ref 5–15)
Anion gap: 9 (ref 5–15)
BUN: 42 mg/dL — ABNORMAL HIGH (ref 8–23)
BUN: 37 mg/dL — ABNORMAL HIGH (ref 8–23)
CO2: 11 mmol/L — ABNORMAL LOW (ref 22–32)
CO2: 18 mmol/L — ABNORMAL LOW (ref 22–32)
Calcium: 9.1 mg/dL (ref 8.9–10.3)
Calcium: 9.1 mg/dL (ref 8.9–10.3)
Chloride: 104 mmol/L (ref 98–111)
Chloride: 106 mmol/L (ref 98–111)
Creatinine, Ser: 1.83 mg/dL — ABNORMAL HIGH (ref 0.61–1.24)
Creatinine, Ser: 1.46 mg/dL — ABNORMAL HIGH (ref 0.61–1.24)
GFR, Estimated: 38 mL/min — ABNORMAL LOW (ref >60)
GFR, Estimated: 50 mL/min — ABNORMAL LOW (ref >60)
Glucose, Bld: 441 mg/dL — ABNORMAL HIGH (ref 70–99)
Glucose, Bld: 215 mg/dL — ABNORMAL HIGH (ref 70–99)
Potassium: 4.7 mmol/L (ref 3.5–5.1)
Potassium: 4.2 mmol/L (ref 3.5–5.1)
Sodium: 135 mmol/L (ref 135–145)
Sodium: 133 mmol/L — ABNORMAL LOW (ref 135–145)

## 2023-09-22 LAB — I-STAT VENOUS BLOOD GAS, ED
Acid-base deficit: 17 mmol/L — ABNORMAL HIGH (ref 0.0–2.0)
Bicarbonate: 9.9 mmol/L — ABNORMAL LOW (ref 20.0–28.0)
Calcium, Ion: 1.18 mmol/L (ref 1.15–1.40)
HCT: 37 % — ABNORMAL LOW (ref 39.0–52.0)
Hemoglobin: 12.6 g/dL — ABNORMAL LOW (ref 13.0–17.0)
O2 Saturation: 83 %
Patient temperature: 98
Potassium: 6.1 mmol/L — ABNORMAL HIGH (ref 3.5–5.1)
Sodium: 129 mmol/L — ABNORMAL LOW (ref 135–145)
TCO2: 11 mmol/L — ABNORMAL LOW (ref 22–32)
pCO2, Ven: 26.6 mmHg — ABNORMAL LOW (ref 44–60)
pH, Ven: 7.177 — CL (ref 7.25–7.43)
pO2, Ven: 57 mmHg — ABNORMAL HIGH (ref 32–45)

## 2023-09-22 LAB — GLUCOSE, CAPILLARY
Glucose-Capillary: 514 mg/dL (ref 70–99)
Glucose-Capillary: 480 mg/dL — ABNORMAL HIGH (ref 70–99)
Glucose-Capillary: 512 mg/dL (ref 70–99)
Glucose-Capillary: 380 mg/dL — ABNORMAL HIGH (ref 70–99)
Glucose-Capillary: 439 mg/dL — ABNORMAL HIGH (ref 70–99)
Glucose-Capillary: 331 mg/dL — ABNORMAL HIGH (ref 70–99)
Glucose-Capillary: 253 mg/dL — ABNORMAL HIGH (ref 70–99)
Glucose-Capillary: 211 mg/dL — ABNORMAL HIGH (ref 70–99)
Glucose-Capillary: 208 mg/dL — ABNORMAL HIGH (ref 70–99)
Glucose-Capillary: 192 mg/dL — ABNORMAL HIGH (ref 70–99)
Glucose-Capillary: 187 mg/dL — ABNORMAL HIGH (ref 70–99)
Glucose-Capillary: 189 mg/dL — ABNORMAL HIGH (ref 70–99)
Glucose-Capillary: 162 mg/dL — ABNORMAL HIGH (ref 70–99)

## 2023-09-22 LAB — BETA-HYDROXYBUTYRIC ACID
Beta-Hydroxybutyric Acid: 7.85 mmol/L — ABNORMAL HIGH (ref 0.05–0.27)
Beta-Hydroxybutyric Acid: 7.17 mmol/L — ABNORMAL HIGH (ref 0.05–0.27)
Beta-Hydroxybutyric Acid: 1.36 mmol/L — ABNORMAL HIGH (ref 0.05–0.27)

## 2023-09-22 LAB — LACTIC ACID, PLASMA
Lactic Acid, Venous: 4.6 mmol/L (ref 0.5–1.9)
Lactic Acid, Venous: 2.7 mmol/L (ref 0.5–1.9)

## 2023-09-22 LAB — CBG MONITORING, ED
Glucose-Capillary: 565 mg/dL (ref 70–99)
Glucose-Capillary: 562 mg/dL (ref 70–99)
Glucose-Capillary: 575 mg/dL (ref 70–99)

## 2023-09-22 LAB — BLOOD GAS, VENOUS
Acid-base deficit: 4.1 mmol/L — ABNORMAL HIGH (ref 0.0–2.0)
Bicarbonate: 21.6 mmol/L (ref 20.0–28.0)
O2 Saturation: 62.3 %
Patient temperature: 36.6
pCO2, Ven: 40 mmHg — ABNORMAL LOW (ref 44–60)
pH, Ven: 7.34 (ref 7.25–7.43)
pO2, Ven: 34 mmHg (ref 32–45)

## 2023-09-22 LAB — MRSA NEXT GEN BY PCR, NASAL: MRSA by PCR Next Gen: NOT DETECTED

## 2023-09-22 MED ORDER — ORAL CARE MOUTH RINSE: 15.0000 mL | OROMUCOSAL | Status: DC | PRN

## 2023-09-22 MED ORDER — DEXTROSE IN LACTATED RINGERS 5 % IV SOLN: INTRAVENOUS | Status: AC

## 2023-09-22 MED ORDER — SODIUM ZIRCONIUM CYCLOSILICATE 10 G PO PACK
10.0000 g | PACK | Freq: Once | ORAL | Status: AC
  Administered 2023-09-22: 10 g via ORAL
  Filled 2023-09-22: qty 1

## 2023-09-22 MED ORDER — DEXTROSE 50 % IV SOLN: 0.0000 mL | INTRAVENOUS | Status: DC | PRN

## 2023-09-22 MED ORDER — ONDANSETRON HCL 4 MG/2ML IJ SOLN
4.0000 mg | Freq: Once | INTRAMUSCULAR | Status: AC
  Administered 2023-09-22: 4 mg via INTRAVENOUS
  Filled 2023-09-22: qty 2

## 2023-09-22 MED ORDER — ONDANSETRON HCL 4 MG/2ML IJ SOLN: 4.0000 mg | Freq: Four times a day (QID) | INTRAMUSCULAR | Status: DC | PRN

## 2023-09-22 MED ORDER — CHLORHEXIDINE GLUCONATE CLOTH 2 % EX PADS
6.0000 | MEDICATED_PAD | Freq: Every day | CUTANEOUS | Status: DC
  Administered 2023-09-22: 6 via TOPICAL

## 2023-09-22 MED ORDER — MELATONIN 5 MG PO TABS
5.0000 mg | ORAL_TABLET | Freq: Once | ORAL | Status: AC
  Administered 2023-09-23: 5 mg via ORAL
  Filled 2023-09-22: qty 1

## 2023-09-22 MED ORDER — SODIUM CHLORIDE 0.9 % IV BOLUS
1000.0000 mL | Freq: Once | INTRAVENOUS | Status: AC
  Administered 2023-09-22: 1000 mL via INTRAVENOUS

## 2023-09-22 MED ORDER — ONDANSETRON HCL 4 MG PO TABS: 4.0000 mg | ORAL_TABLET | Freq: Four times a day (QID) | ORAL | Status: DC | PRN

## 2023-09-22 MED ORDER — ENOXAPARIN SODIUM 40 MG/0.4ML IJ SOSY
40.0000 mg | PREFILLED_SYRINGE | Freq: Every day | INTRAMUSCULAR | Status: DC
  Administered 2023-09-22: 40 mg via SUBCUTANEOUS
  Filled 2023-09-22: qty 0.4

## 2023-09-22 MED ORDER — CALCIUM GLUCONATE-NACL 1-0.675 GM/50ML-% IV SOLN
1.0000 g | Freq: Once | INTRAVENOUS | Status: AC
  Administered 2023-09-22: 1000 mg via INTRAVENOUS
  Filled 2023-09-22: qty 50

## 2023-09-22 MED ORDER — ACETAMINOPHEN 325 MG PO TABS: 650.0000 mg | ORAL_TABLET | Freq: Four times a day (QID) | ORAL | Status: DC | PRN

## 2023-09-22 MED ORDER — LACTATED RINGERS IV BOLUS
20.0000 mL/kg | Freq: Once | INTRAVENOUS | Status: AC
  Administered 2023-09-22: 1542 mL via INTRAVENOUS

## 2023-09-22 MED ORDER — INSULIN REGULAR(HUMAN) IN NACL 100-0.9 UT/100ML-% IV SOLN
INTRAVENOUS | Status: AC
  Administered 2023-09-22: 4.2 [IU]/h via INTRAVENOUS
  Administered 2023-09-22: 8 [IU]/h via INTRAVENOUS
  Filled 2023-09-22 (×2): qty 100

## 2023-09-22 MED ORDER — LACTATED RINGERS IV SOLN: INTRAVENOUS | Status: DC

## 2023-09-22 NOTE — Assessment & Plan Note (Signed)
 Continue home dementia

## 2023-09-22 NOTE — Assessment & Plan Note (Addendum)
 Meeting DKA criteria with noted blood sugars 600s, bicarb 10, pH 7.1 in setting of baseline Type 1 diabetes with insulin  pump in place  Anion gap 29  Clinically dry  Noted baseline dementia with dexcom use and regular instrument manipulation by patient- likely confounding issue  Started on DKA protocol  Monitor  Transition to long acting insulin  and SSI once anion gap closed and BHB normalizes  Diabetic educator at the bedside  Consider transition to traditional sliding scale given baseline dementia and dexcom manipulation  Follow

## 2023-09-22 NOTE — Progress Notes (Signed)
Patient removed insulin pump 

## 2023-09-22 NOTE — ED Provider Notes (Incomplete)
 [place] EMERGENCY DEPARTMENT AT [facility] Provider Note   CSN: [id number] Arrival date & time: 09/22/23  [id number]     Patient presents with: No chief complaint on file.   Rodney Miller is a 73 y.o. male.  {Add pertinent medical, surgical, social history, OB history to HPI:32947} HPI      73yo male with history of DM, hypertension, hypothyroidism, OSA, MDD, memory loss   2 days of nausea, vomiting, more confused then normal memory problems  Glucose has been high Giving insulin  but has still been high Vomiting 5 times yesterday, 3 times here  Diarrhea yesterday, no black or bloody Appetite has been ok, but then vomits Has not complained of abdominal pain No fever, cough, congestion, pain with urination No recent diet change, does have a sweet tooth No recent change in DM medications, wife gives them    Past Medical History:  Diagnosis Date   Diabetes mellitus without complication (HCC)    Hypertension    Hypothyroidism    Kidney stones     Past Surgical History:  Procedure Laterality Date   CYST EXCISION Right    leg   IR URETERAL STENT LEFT NEW ACCESS W/O SEP NEPHROSTOMY CATH  12/07/2021   LITHOTRIPSY     NEPHROLITHOTOMY Left 12/07/2021   Procedure: FIRST STAGE LEFT NEPHROLITHOTOMY PERCUTANEOUS;  Surgeon: Watt Rush, MD;  Location: [facility];  Service: Urology;  Laterality: Left;    Prior to Admission medications   Medication Sig Start Date End Date Taking? Authorizing Provider  atorvastatin  (LIPITOR) 20 MG tablet Take 1 tablet (20 mg total) by mouth daily. 09/26/20   Kennyth Worth HERO, MD  CELEXA  20 MG tablet Take 20 mg by mouth daily. 09/25/21   [provider]  Continuous Blood Gluc Sensor (DEXCOM G6 SENSOR) MISC 1 Device by Does not apply route as directed. 07/27/19   Shamleffer, Ibtehal Jaralla, MD  Continuous Blood Gluc Transmit (DEXCOM G6 TRANSMITTER) MISC 1 Device by Does not apply route as directed. 07/27/19   Shamleffer, Ibtehal Jaralla, MD   donepezil (ARICEPT) 5 MG tablet Take 5 mg by mouth daily. Patient not taking: Reported on 07/15/2023 02/03/22   [provider]  insulin  aspart (NOVOLOG ) 100 UNIT/ML injection USE UP TO 75 UNITS DAILY VIA INSULIN  PUMP AS DIRECTED Patient taking differently: USE UP TO 70 UNITS DAILY VIA INSULIN  PUMP AS DIRECTED 11/15/19   Kennyth Worth HERO, MD  Insulin  Infusion Pump (T:SLIM INSULIN  PUMP) DEVI by Does not apply route.    [provider]  levothyroxine  (SYNTHROID ) 75 MCG tablet TAKE ONE TABLET BY MOUTH DAILY BEFORE BREAKFAST 02/06/21   Kennyth Worth HERO, MD  lisinopril  (ZESTRIL ) 20 MG tablet Take 20 mg by mouth at bedtime.    [provider]  meloxicam  (MOBIC ) 7.5 MG tablet Take 1 tablet (7.5 mg total) by mouth daily. 05/27/23   McDonald, Juliene SAUNDERS, DPM  Multiple Vitamin (MULTIVITAMIN WITH MINERALS) TABS tablet Take 1 tablet by mouth daily.    [provider]  Multiple Vitamins-Minerals (VITAMIN D3 COMPLETE PO) Take 1 tablet by mouth daily.    [provider]  traMADol  (ULTRAM ) 50 MG tablet TAKE 1 TABLET BY MOUTH EVERY 6 HOURS AS NEEDED Patient not taking: Reported on 07/15/2023 12/06/21   Watt Rush, MD    Allergies: Patient has no known allergies.    Review of Systems  Updated Vital Signs There were no vitals taken for this visit.  Physical Exam  (all labs ordered are listed, but only  abnormal results are displayed) Labs Reviewed - No data to display  EKG: None  Radiology: No results found.  {Document cardiac monitor, telemetry assessment procedure when appropriate:32947} Procedures   Medications Ordered in the ED - No data to display    {Click here for ABCD2, HEART and other calculators REFRESH Note before signing:1}                              Medical Decision Making Amount and/or Complexity of Data Reviewed Labs: ordered. Radiology: ordered.  Risk Prescription drug management. Decision regarding  hospitalization.   ***  {Document critical care time when appropriate  Document review of labs and clinical decision tools ie CHADS2VASC2, etc  Document your independent review of radiology images and any outside records  Document your discussion with family members, caretakers and with consultants  Document social determinants of health affecting pt's care  Document your decision making why or why not admission, treatments were needed:32947:::1}   Final diagnoses:  None    ED Discharge Orders     None

## 2023-09-22 NOTE — ED Triage Notes (Signed)
 C/o hyperglycemia and n/v/d x 2 days. Type 1 DM. Hx of dementia.

## 2023-09-22 NOTE — Assessment & Plan Note (Signed)
 BP stable Titrate home regimen

## 2023-09-22 NOTE — Care Plan (Signed)
 Plan of Care Note for accepted transfer   Patient: Rodney Miller MRN: [id number]   DOA: 09/22/2023  Facility requesting transfer: [facility] Requesting Provider: Dr. Dreama Reason for transfer: DKA Facility course: Patient pleasant 73 year old gentleman history of type 1 diabetes with an insulin  pump, hypertension, hypothyroidism, OSA, MDD, memory loss presented to the ED with a 2-day history of nausea vomiting, worsening confusion times baseline.  Patient noted blood glucose levels have been high despite ongoing insulin  administration and noted blood glucose levels per EDP to be > 500s.  Patient seen in the ED with vital signs stable, comprehensive metabolic profile with a sodium of 132, potassium of 6.4, glucose of 619, bicarb of 10, BUN of 42, creatinine of 1.81, anion gap of 29.  CBC with a white count of 15.5, hemoglobin of 11.9 otherwise within normal limits.  VBG with a pH of 7.17/PCO2 of 26/PO2 of 57/bicarb of 9.9.  Urinalysis pending.  Beta hydroxybutyric acid pending.  Patient placed on Endo tool/glucose stabilizer/insulin  drip, IV fluids.  Patient also received IV calcium  and Lokelma .  Hospitalist called for admission.  Plan of care: The patient is accepted for admission to [facility] unit, at [facility]..   Author: Toribio Hummer, MD 09/22/2023  Check [email or web address] for on-call coverage.  Nursing staff, Please call [facility] Admits & Consults System-Wide number on Amion as soon as patient's arrival, so appropriate admitting provider can evaluate the pt.

## 2023-09-22 NOTE — H&P (Addendum)
 History and Physical    Patient: Rodney Miller FMW:[id number] DOB: 10/07/1950 DOA: 09/22/2023 DOS: the patient was seen and examined on 09/22/2023 PCP: [facility]  Patient coming from: Home  Chief Complaint:  Chief Complaint  Patient presents with   Hyperglycemia   HPI: Rodney Miller is a 73 y.o. male with medical history significant of type 1 diabetes, dementia, hypertension, hypothyroidism presenting with DKA, encephalopathy, AKI.  Limited history in the setting of encephalopathy.  History primarily from patient's daughter who is at the bedside.  Per report, patient with worsening confusion as well as decreased p.o. intake over the past 3 to 4 days.  Had type 1 diabetes.  Wears a Dexcom chronically.  Per the daughter, patient has episodes of chronically manipulating and pulling on cords and instrumentation.  Per the daughter, had nausea and decreased p.o. intake around the same time of instrumentation manipulation.  No fevers or chills.  No reported diarrhea or abdominal pain.  No reports of cough, chest pain, shortness of breath.  Daughter does report worsening generalized mentation in the setting of baseline dementia.  No reported falls.  No reported recent infections. Presented to the ER afebrile, heart rate 80s to 100s, BP 100s to 140s over 40s to 50s.  Satting well on room air.  White count 15.5, hemoglobin 11.9, platelets 304, creatinine 1.81, glucose 619.  CO2 10.  Potassium 6.4.  BHB 7.85.  VBG with a pH of 7.17, bicarb of 9, CO2 of 27.  Urinalysis with positive ketones.  Lactate of 4.6. Review of Systems: As mentioned in the history of present illness. All other systems reviewed and are negative. Past Medical History:  Diagnosis Date   Diabetes mellitus without complication (HCC)    Hypertension    Hypothyroidism    Kidney stones    Past Surgical History:  Procedure Laterality Date   CYST EXCISION Right    leg   IR URETERAL STENT LEFT NEW ACCESS W/O SEP  NEPHROSTOMY CATH  12/07/2021   LITHOTRIPSY     NEPHROLITHOTOMY Left 12/07/2021   Procedure: FIRST STAGE LEFT NEPHROLITHOTOMY PERCUTANEOUS;  Surgeon: Watt Rush, MD;  Location: [facility];  Service: Urology;  Laterality: Left;   Social History:  reports that he has never smoked. He has never used smokeless tobacco. He reports that he does not currently use alcohol. He reports that he does not use drugs.  No Known Allergies  Family History  Problem Relation Age of Onset   Juvenile Diabetes Son    Juvenile Diabetes Grandchild    Prostate cancer Neg Hx    Eber cancer Neg Hx     Prior to Admission medications   Medication Sig Start Date End Date Taking? Authorizing Provider  atorvastatin  (LIPITOR) 20 MG tablet Take 1 tablet (20 mg total) by mouth daily. 09/26/20  Yes Kennyth Worth HERO, MD  DULoxetine (CYMBALTA) 60 MG capsule Take 60 mg by mouth daily. 09/17/23  Yes [provider]  gabapentin (NEURONTIN) 300 MG capsule Take 300 mg by mouth daily. 09/04/23  Yes [provider]  insulin  aspart (NOVOLOG ) 100 UNIT/ML injection USE UP TO 75 UNITS DAILY VIA INSULIN  PUMP AS DIRECTED Patient taking differently: USE UP TO 90 UNITS DAILY VIA INSULIN  PUMP AS DIRECTED 11/15/19  Yes Kennyth Worth HERO, MD  levothyroxine  (SYNTHROID ) 50 MCG tablet Take 50 mcg by mouth daily. 09/12/23  Yes [provider]  lisinopril  (ZESTRIL ) 40 MG tablet Take 40 mg by mouth daily. 08/15/23  Yes [provider]  MELATONIN PO Take 10 mg by mouth at bedtime.   Yes [provider]  memantine (NAMENDA) 5 MG tablet Take 5 mg by mouth at bedtime. 09/04/23  Yes [provider]  Multiple Vitamin (MULTIVITAMIN WITH MINERALS) TABS tablet Take 1 tablet by mouth daily.   Yes [provider]  Multiple Vitamins-Minerals (VITAMIN D3 COMPLETE PO) Take 1 tablet by mouth daily.   Yes [provider]  tamsulosin (FLOMAX) 0.4 MG CAPS capsule Take 0.4 mg by mouth every evening. 09/14/23   Yes [provider]  levothyroxine  (SYNTHROID ) 75 MCG tablet TAKE ONE TABLET BY MOUTH DAILY BEFORE BREAKFAST Patient not taking: Reported on 09/22/2023 02/06/21   Kennyth Worth HERO, MD    Physical Exam: Vitals:   09/22/23 1207 09/22/23 1212 09/22/23 1300 09/22/23 1400  BP: (!) 145/45  (!) 127/36 (!) 106/45  Pulse:   90 82  Resp:  13 20 12   Temp:  98.8 F (37.1 C)    TempSrc:  Oral    SpO2:  96% 99% 98%  Weight:  72 kg    Height:  5' 2 (1.575 m)     Physical Exam Constitutional:      Appearance: He is normal weight.     Comments: + mild generalized confusion and lethargy    HENT:     Head: Normocephalic and atraumatic.     Nose: Nose normal.     Mouth/Throat:     Mouth: Mucous membranes are dry.  Eyes:     Pupils: Pupils are equal, round, and reactive to light.  Cardiovascular:     Rate and Rhythm: Normal rate and regular rhythm.  Abdominal:     General: Abdomen is flat.  Musculoskeletal:        General: Normal range of motion.  Skin:    General: Skin is warm and dry.  Neurological:     Comments: Mild generalized confusion  Grossly nonfocal neuro exam    Psychiatric:        Mood and Affect: Mood normal.     Data Reviewed:  There are no new results to review at this time.  DG Abd 2 Views CLINICAL DATA:  [id number] Nausea and vomiting [id number].  EXAM: ABDOMEN - 2 VIEW  COMPARISON:  12/08/2021.  FINDINGS: The bowel gas pattern is non-obstructive.  No abnormal stool burden.  No evidence of pneumoperitoneum, within the limitations of a supine film.  No acute osseous abnormalities.  The soft tissues are within normal limits.  Surgical changes, devices, tubes and lines: None.  IMPRESSION: *Nonobstructive bowel gas pattern.  Electronically Signed   By: Ree Molt M.D.   On: 09/22/2023 11:22 DG Chest Portable 1 View CLINICAL DATA:  dka, eval for infection.  EXAM: PORTABLE CHEST 1 VIEW  COMPARISON:  None Available.  FINDINGS: Bilateral  lung fields are clear. Bilateral costophrenic angles are clear.  Normal cardio-mediastinal silhouette.  No acute osseous abnormalities.  The soft tissues are within normal limits.  IMPRESSION: No active disease.  Electronically Signed   By: Ree Molt M.D.   On: 09/22/2023 11:21  Lab Results  Component Value Date   WBC 15.5 (H) 09/22/2023   HGB 12.6 (L) 09/22/2023   HCT 37.0 (L) 09/22/2023   MCV 99.5 09/22/2023   PLT 304 09/22/2023   Last metabolic panel Lab Results  Component Value Date   GLUCOSE 441 (H) 09/22/2023   NA 135 09/22/2023   K 4.7 09/22/2023   CL 104 09/22/2023  CO2 11 (L) 09/22/2023   BUN 42 (H) 09/22/2023   CREATININE 1.83 (H) 09/22/2023   GFRNONAA 38 (L) 09/22/2023   CALCIUM  9.1 09/22/2023   PROT 7.1 09/22/2023   ALBUMIN 4.2 09/22/2023   BILITOT 0.7 09/22/2023   ALKPHOS 88 09/22/2023   AST 18 09/22/2023   ALT 21 09/22/2023   ANIONGAP 20 (H) 09/22/2023    Assessment and Plan: * DKA (diabetic ketoacidosis) (HCC) Meeting DKA criteria with noted blood sugars 600s, bicarb 10, pH 7.1 in setting of baseline Type 1 diabetes with insulin  pump in place  Anion gap 29  Clinically dry  Noted baseline dementia with dexcom use and regular instrument manipulation by patient- likely confounding issue  Started on DKA protocol  Monitor  Transition to long acting insulin  and SSI once anion gap closed and BHB normalizes  Diabetic educator at the bedside  Consider transition to traditional sliding scale given baseline dementia and dexcom manipulation  Follow      AKI (acute kidney injury) (HCC) Cr 1.8 today with GFR in the 30s  Will above baseline  Markedly dry in setting of DKA  IVF hydration  Hold nephrotoxic agents  Check FeNa  Renal ultrasound x1  Monitor   Leukocytosis WBC 15  No overt infection noted  Suspect secondary to hemoconcentration in setting of dehydration assd w/ DKA  Will panculture  Trend sodium with treatment     Encephalopathy Mild generalized lethargy and confusion on presentation in setting of DKA, dehydration and baseline dementia  -unclear of general baseline  Non focal neuro exam  Will check CT head and ammonia level x1  Monitor mentation with hydration and treatment   Hypothyroidism Cont synthroid     Dementia (HCC) Continue home dementia    Hypertension associated with diabetes (HCC) BP stable  Titrate home regimen      Greater than 50% was spent in counseling and coordination of care with patient Critical care time: 60+ minutes     Advance Care Planning:   Code Status: Full Code   Consults: None   Family Communication: Daughter at the bedside   Severity of Illness: The appropriate patient status for this patient is INPATIENT. Inpatient status is judged to be reasonable and necessary in order to provide the required intensity of service to ensure the patient's safety. The patient's presenting symptoms, physical exam findings, and initial radiographic and laboratory data in the context of their chronic comorbidities is felt to place them at high risk for further clinical deterioration. Furthermore, it is not anticipated that the patient will be medically stable for discharge from the hospital within 2 midnights of admission.   * I certify that at the point of admission it is my clinical judgment that the patient will require inpatient hospital care spanning beyond 2 midnights from the point of admission due to high intensity of service, high risk for further deterioration and high frequency of surveillance required.*  Author: Elspeth JINNY Masters, MD 09/22/2023 2:32 PM  For on call review [email or web address].

## 2023-09-22 NOTE — Progress Notes (Signed)
 Insulin  pump given to wife [name]

## 2023-09-22 NOTE — Assessment & Plan Note (Signed)
 Mild generalized lethargy and confusion on presentation in setting of DKA, dehydration and baseline dementia  -unclear of general baseline  Non focal neuro exam  Will check CT head and ammonia level x1  Monitor mentation with hydration and treatment

## 2023-09-22 NOTE — ED Notes (Signed)
 Report given to Barnie, Charity fundraiser at [employer].

## 2023-09-22 NOTE — Assessment & Plan Note (Addendum)
 Cont synthroid

## 2023-09-22 NOTE — ED Notes (Signed)
 Called [facility] to transport patient to [facility] rm# [id number]

## 2023-09-22 NOTE — ED Notes (Signed)
 Fingerstick blood glucose 562. Results not crossing over into chart at this time.

## 2023-09-22 NOTE — Assessment & Plan Note (Signed)
 WBC 15  No overt infection noted  Suspect secondary to hemoconcentration in setting of dehydration assd w/ DKA  Will panculture  Trend sodium with treatment

## 2023-09-22 NOTE — Assessment & Plan Note (Signed)
 Cr 1.8 today with GFR in the 30s  Will above baseline  Markedly dry in setting of DKA  IVF hydration  Hold nephrotoxic agents  Check FeNa  Renal ultrasound x1  Monitor

## 2023-09-22 NOTE — ED Notes (Signed)
 Rodney Miller [facility] called and notified that [facility] is here to transport patient to their floor.

## 2023-09-23 ENCOUNTER — Inpatient Hospital Stay (HOSPITAL_COMMUNITY)

## 2023-09-23 DIAGNOSIS — E1010 Type 1 diabetes mellitus with ketoacidosis without coma: Secondary | ICD-10-CM | POA: Diagnosis not present

## 2023-09-23 LAB — BASIC METABOLIC PANEL WITH GFR
Anion gap: 6 (ref 5–15)
Anion gap: 7 (ref 5–15)
BUN: 32 mg/dL — ABNORMAL HIGH (ref 8–23)
BUN: 32 mg/dL — ABNORMAL HIGH (ref 8–23)
CO2: 18 mmol/L — ABNORMAL LOW (ref 22–32)
CO2: 22 mmol/L (ref 22–32)
Calcium: 8.6 mg/dL — ABNORMAL LOW (ref 8.9–10.3)
Calcium: 8.9 mg/dL (ref 8.9–10.3)
Chloride: 108 mmol/L (ref 98–111)
Chloride: 111 mmol/L (ref 98–111)
Creatinine, Ser: 1.35 mg/dL — ABNORMAL HIGH (ref 0.61–1.24)
Creatinine, Ser: 1.36 mg/dL — ABNORMAL HIGH (ref 0.61–1.24)
GFR, Estimated: 55 mL/min — ABNORMAL LOW (ref >60)
GFR, Estimated: 55 mL/min — ABNORMAL LOW (ref >60)
Glucose, Bld: 132 mg/dL — ABNORMAL HIGH (ref 70–99)
Glucose, Bld: 134 mg/dL — ABNORMAL HIGH (ref 70–99)
Potassium: 3.6 mmol/L (ref 3.5–5.1)
Potassium: 3.7 mmol/L (ref 3.5–5.1)
Sodium: 133 mmol/L — ABNORMAL LOW (ref 135–145)
Sodium: 139 mmol/L (ref 135–145)

## 2023-09-23 LAB — CBC
HCT: 30.7 % — ABNORMAL LOW (ref 39.0–52.0)
Hemoglobin: 10.3 g/dL — ABNORMAL LOW (ref 13.0–17.0)
MCH: 32.5 pg (ref 26.0–34.0)
MCHC: 33.6 g/dL (ref 30.0–36.0)
MCV: 96.8 fL (ref 80.0–100.0)
Platelets: 243 K/uL (ref 150–400)
RBC: 3.17 MIL/uL — ABNORMAL LOW (ref 4.22–5.81)
RDW: 12.5 % (ref 11.5–15.5)
WBC: 13.1 K/uL — ABNORMAL HIGH (ref 4.0–10.5)
nRBC: 0 % (ref 0.0–0.2)

## 2023-09-23 LAB — GLUCOSE, CAPILLARY
Glucose-Capillary: 128 mg/dL — ABNORMAL HIGH (ref 70–99)
Glucose-Capillary: 137 mg/dL — ABNORMAL HIGH (ref 70–99)
Glucose-Capillary: 138 mg/dL — ABNORMAL HIGH (ref 70–99)
Glucose-Capillary: 184 mg/dL — ABNORMAL HIGH (ref 70–99)
Glucose-Capillary: 266 mg/dL — ABNORMAL HIGH (ref 70–99)

## 2023-09-23 LAB — CREATININE, URINE, RANDOM: Creatinine, Urine: 204 mg/dL

## 2023-09-23 LAB — BETA-HYDROXYBUTYRIC ACID: Beta-Hydroxybutyric Acid: 0.23 mmol/L (ref 0.05–0.27)

## 2023-09-23 LAB — SODIUM, URINE, RANDOM: Sodium, Ur: <10 mmol/L

## 2023-09-23 MED ORDER — INSULIN ASPART 100 UNIT/ML IJ SOLN: 0.0000 [IU] | Freq: Three times a day (TID) | INTRAMUSCULAR | 11 refills | Status: AC

## 2023-09-23 MED ORDER — INSULIN ASPART 100 UNIT/ML IJ SOLN: 0.0000 [IU] | Freq: Every day | INTRAMUSCULAR | Status: DC

## 2023-09-23 MED ORDER — INSULIN ASPART 100 UNIT/ML IJ SOLN
0.0000 [IU] | Freq: Three times a day (TID) | INTRAMUSCULAR | Status: DC
  Administered 2023-09-23: 8 [IU] via SUBCUTANEOUS
  Administered 2023-09-23: 3 [IU] via SUBCUTANEOUS

## 2023-09-23 MED ORDER — INSULIN ASPART 100 UNIT/ML IJ SOLN: 0.0000 [IU] | Freq: Three times a day (TID) | INTRAMUSCULAR | Status: DC

## 2023-09-23 MED ORDER — INSULIN GLARGINE-YFGN 100 UNIT/ML ~~LOC~~ SOLN
5.0000 [IU] | Freq: Once | SUBCUTANEOUS | Status: AC
  Administered 2023-09-23: 5 [IU] via SUBCUTANEOUS
  Filled 2023-09-23: qty 0.05

## 2023-09-23 NOTE — Progress Notes (Signed)
   09/23/23 0904  TOC Brief Assessment  Insurance and Status Reviewed  Patient has primary care physician Yes  Home environment has been reviewed single family home  Prior level of function: independent  Prior/Current Home Services No current home services  Social Drivers of Health Review SDOH reviewed no interventions necessary  Readmission risk has been reviewed Yes  Transition of care needs no transition of care needs at this time    Heather Saltness, MSW, LCSW 09/23/2023 9:04 AM

## 2023-09-23 NOTE — Discharge Summary (Signed)
 Physician Discharge Summary   Patient: Rodney Miller MRN: [id number] DOB: [birth year]  Admit date:     09/22/2023  Discharge date: 09/23/23  Discharge Physician: Delon Herald   PCP: [facility]   Recommendations at discharge:   Stop using insulin  pump Change to Semglee  12 units once daily; 3 units Novolog  3 times daily with meals if he eats >50%; plus sensitive-scale sliding scale insulin   Sliding scale insulin : CBG 121-150 = 1 unit CBG 151-200 = 2 units CBG 201-250 = 3 units CBG 251-300 = 5 units CBG 301-350 = 7 units CBG 351-400 = 9 units CBG > 400 = call Dr. Beryl Hold lisinopril  until follow up appointment Follow up with Dr. Beryl regarding possible pod insulin  or other alternatives Follow up with PCP in 1-2 weeks; call for an appointment  Discharge Diagnoses: Principal Problem:   DKA (diabetic ketoacidosis) (HCC) Active Problems:   AKI (acute kidney injury) (HCC)   Hypertension associated with diabetes (HCC)   Dementia (HCC)   Hyperkalemia   Dehydration   Encephalopathy   Leukocytosis    Hospital Course: 73yo with T1DM, dementia, HTN, and hypothyroidism who presented on 7/7 with AMS.  He was found to be in DKA (pH 7.17, CO2 10) with AKI.  He was started on Endotool and DKA resolved overnight.  Assessment and Plan:  DKA (diabetic ketoacidosis) Meeting DKA criteria with blood sugars 600s, bicarb 10, pH 7.1, anion gap 29 in setting of baseline Type 1 diabetes with insulin  pump in place  Noted baseline dementia with dexcom use and regular instrument manipulation by patient- likely confounding issue  Started on DKA protocol with Endotool  DKA resolved overnight Transition to long acting insulin  and SSI  Semglee  12 units with 3 units TID for meal coverage (>50%) and sensitive scale SSI Diabetic coordinator assisting Will dc to home today Needs outpatient endocrinology f/u with Dr. Beryl to discuss alternative options for long-term diabetes  management    AKI (acute kidney injury)  Cr 1.8 on presentation  GFR >60 at baseline  Markedly dry in setting of DKA  Improved with IVF hydration  Normal renal US    Leukocytosis WBC 15 -> 13.1 No overt infection noted  Likely hemoconcentration in setting of dehydration assd w/ DKA in conjunction with acute phase response Blood cultures NTD  Encephalopathy Mild generalized lethargy and confusion on presentation in setting of DKA, dehydration and baseline dementia  Non focal neuro exam  Head CT with age-related atrophy and mild periventricular white matter disease Appears to be at baseline this AM   Hypothyroidism Continue synthroid    HLD Continue atorvastatin    Dementia Continue duloxetine, memantine, melatonin, gabapentin Wife reports that she is effectively able to manage him at home    Hypertension associated with diabetes  BP stable throughout hospitalization Hold lisinopril  until PCP follow up  BPH Continue tamsulosin     Consultants: DM coordinator  Procedures: None  Antibiotics: None  30 Day Unplanned Readmission Risk Score    Flowsheet Row ED to Hosp-Admission (Current) from 09/22/2023 in [facility]ICU/STEPDOWN  30 Day Unplanned Readmission Risk Score (%) 16.05 Filed at 09/23/2023 0401    This score is the patient's risk of an unplanned readmission within 30 days of being discharged (0 -100%). The score is based on dignosis, age, lab data, medications, orders, and past utilization.   Low:  0-14.9   Medium: 15-21.9   High: 22-29.9   Extreme: 30 and above  Pain control - [place]  Controlled Substance Reporting System database was reviewed. and patient was instructed, not to drive, operate heavy machinery, perform activities at heights, swimming or participation in water activities or provide baby-sitting services while on Pain, Sleep and Anxiety Medications; until their outpatient Physician has advised to do so  again. Also recommended to not to take more than prescribed Pain, Sleep and Anxiety Medications.   Disposition: Home Diet recommendation:  Carb modified diet DISCHARGE MEDICATION: Allergies as of 09/23/2023   No Known Allergies      Medication List     PAUSE taking these medications    lisinopril  40 MG tablet Wait to take this until your doctor or other care provider tells you to start again. Commonly known as: ZESTRIL  Take 40 mg by mouth daily.       STOP taking these medications    insulin  aspart 100 UNIT/ML injection Commonly known as: NovoLOG  Replaced by: insulin  aspart 100 UNIT/ML injection       TAKE these medications    atorvastatin  20 MG tablet Commonly known as: LIPITOR Take 1 tablet (20 mg total) by mouth daily.   DULoxetine 60 MG capsule Commonly known as: CYMBALTA Take 60 mg by mouth daily.   gabapentin 300 MG capsule Commonly known as: NEURONTIN Take 300 mg by mouth daily.   insulin  aspart 100 UNIT/ML injection Commonly known as: novoLOG  Inject 0-9 Units into the skin 3 (three) times daily with meals. Replaces: insulin  aspart 100 UNIT/ML injection   levothyroxine  50 MCG tablet Commonly known as: SYNTHROID  Take 50 mcg by mouth daily. What changed: Another medication with the same name was removed. Continue taking this medication, and follow the directions you see here.   MELATONIN PO Take 10 mg by mouth at bedtime.   memantine 5 MG tablet Commonly known as: NAMENDA Take 5 mg by mouth at bedtime.   multivitamin with minerals Tabs tablet Take 1 tablet by mouth daily.   tamsulosin 0.4 MG Caps capsule Commonly known as: FLOMAX Take 0.4 mg by mouth every evening.   VITAMIN D3 COMPLETE PO Take 1 tablet by mouth daily.        Discharge Exam:    Subjective: Pleasant, confused, no complaints.  Wife is present and clearly on top of the situation, doing a great job caring for him.   Objective: Vitals:   09/23/23 1000 09/23/23 1200   BP:    Pulse: 75 78  Resp: 19 17  Temp:  97.9 F (36.6 C)  SpO2: 97% 99%    Intake/Output Summary (Last 24 hours) at 09/23/2023 1356 Last data filed at 09/23/2023 0830 Gross per 24 hour  Intake 2458.77 ml  Output 800 ml  Net 1658.77 ml   Filed Weights   09/22/23 0900 09/22/23 1212  Weight: 77.1 kg 72 kg    Exam:  General:  Appears calm and comfortable and is in NAD Eyes:  normal lids, iris ENT:  grossly normal hearing, lips & tongue, mmm Cardiovascular:  RRR. No LE edema.  Respiratory:   CTA bilaterally with no wheezes/rales/rhonchi.  Normal respiratory effort. Abdomen:  soft, NT, ND Skin:  no rash or induration seen on limited exam Musculoskeletal:  grossly normal tone BUE/BLE, good ROM, no bony abnormality Psychiatric:  pleasant mood and affect, speech fluent and appropriate, AOx2 Neurologic:  CN 2-12 grossly intact, moves all extremities in coordinated fashion  Data Reviewed: I have reviewed the patient's lab results since admission.  Pertinent labs for today include:  Glucose 132  BUN 32/Creatinine 1.36/GFR 55, improved from 42/1.81/39; 14/1.04/75 on 5/23 WBC 13.1 Hgb 10.3     Condition at discharge: improving  The results of significant diagnostics from this hospitalization (including imaging, microbiology, ancillary and laboratory) are listed below for reference.   Imaging Studies: CT HEAD WO CONTRAST ( ) Result Date: 09/23/2023 CLINICAL DATA:  Mental status change, unknown cause EXAM: CT HEAD WITHOUT CONTRAST TECHNIQUE: Contiguous axial images were obtained from the base of the skull through the vertex without intravenous contrast. RADIATION DOSE REDUCTION: This exam was performed according to the departmental dose-optimization program which includes automated exposure control, adjustment of the mA and/or kV according to patient size and/or use of iterative reconstruction technique. COMPARISON:  MRI of the head dated June 25, 2020. FINDINGS: Brain: Generalized  cerebral and cerebellar volume loss. Mild periventricular white matter disease. No evidence of hemorrhage, mass, acute cortical infarct or hydrocephalus. Vascular: Negative. Skull: Intact and unremarkable. Sinuses/Orbits: Status post bilateral lens replacement. No acute process. Other: None. IMPRESSION: 1. Age-related atrophy and mild periventricular white matter disease. Electronically Signed   By: Evalene Coho M.D.   On: 09/23/2023 09:43   US  RENAL Result Date: 09/22/2023 CLINICAL DATA:  Acute kidney injury EXAM: RENAL / URINARY TRACT ULTRASOUND COMPLETE COMPARISON:  None available FINDINGS: Right Kidney: Renal measurements: 7.7 x 4.3 x 4.8 cm = volume: 83 mL. Echogenicity within normal limits. No mass or hydronephrosis visualized. Left Kidney: Renal measurements: 10.5 x 5.5 x 4.7 cm = volume: 141 mL. Echogenicity within normal limits. No mass or hydronephrosis visualized. Bladder: Appears normal for degree of bladder distention. Other: None. IMPRESSION: No significant sonographic abnormality of the kidneys. Electronically Signed   By: Aliene Lloyd M.D.   On: 09/22/2023 16:48   DG Abd 2 Views Result Date: 09/22/2023 CLINICAL DATA:  [id number] Nausea and vomiting [id number]. EXAM: ABDOMEN - 2 VIEW COMPARISON:  12/08/2021. FINDINGS: The bowel gas pattern is non-obstructive.  No abnormal stool burden. No evidence of pneumoperitoneum, within the limitations of a supine film. No acute osseous abnormalities. The soft tissues are within normal limits. Surgical changes, devices, tubes and lines: None. IMPRESSION: *Nonobstructive bowel gas pattern. Electronically Signed   By: Ree Molt M.D.   On: 09/22/2023 11:22   DG Chest Portable 1 View Result Date: 09/22/2023 CLINICAL DATA:  dka, eval for infection. EXAM: PORTABLE CHEST 1 VIEW COMPARISON:  None Available. FINDINGS: Bilateral lung fields are clear. Bilateral costophrenic angles are clear. Normal cardio-mediastinal silhouette. No acute osseous abnormalities. The  soft tissues are within normal limits. IMPRESSION: No active disease. Electronically Signed   By: Ree Molt M.D.   On: 09/22/2023 11:21    Microbiology: Results for orders placed or performed during the hospital encounter of 09/22/23  MRSA Next Gen by PCR, Nasal     Status: None   Collection Time: 09/22/23 12:23 PM   Specimen: Nasal Mucosa; Nasal Swab  Result Value Ref Range Status   MRSA by PCR Next Gen NOT DETECTED NOT DETECTED Final    Comment: (NOTE) The GeneXpert MRSA Assay (FDA approved for NASAL specimens only), is one component of a comprehensive MRSA colonization surveillance program. It is not intended to diagnose MRSA infection nor to guide or monitor treatment for MRSA infections. Test performance is not FDA approved in patients less than 34 years old. Performed at [facility], [address] [place], [place] [address]   Culture, blood (Routine X 2) w Reflex to ID Panel     Status: None (Preliminary  result)   Collection Time: 09/22/23  7:01 PM   Specimen: BLOOD RIGHT HAND  Result Value Ref Range Status   Specimen Description   Final    BLOOD RIGHT HAND Performed at [facility], [address] [place], [place] [address]    Special Requests   Final    BOTTLES DRAWN AEROBIC ONLY Blood Culture results may not be optimal due to an inadequate volume of blood received in culture bottles Performed at [facility], [address], [place], [place] [address]    Culture   Final    NO GROWTH < 24 HOURS Performed at [facility], [address], [place], [place] [address]    Report Status PENDING  Incomplete  Culture, blood (Routine X 2) w Reflex to ID Panel     Status: None (Preliminary result)   Collection Time: 09/22/23  7:05 PM   Specimen: BLOOD RIGHT HAND  Result Value Ref Range Status   Specimen Description   Final    BLOOD RIGHT HAND Performed at [facility], [address] [place], [place] [address]     Special Requests   Final    BOTTLES DRAWN AEROBIC ONLY Blood Culture results may not be optimal due to an inadequate volume of blood received in culture bottles Performed at [facility], [address], [place], [place] [address]    Culture   Final    NO GROWTH < 24 HOURS Performed at [facility], [address] [place], [place] [address]    Report Status PENDING  Incomplete    Labs: CBC: Recent Labs  Lab 09/22/23 0905 09/22/23 0924 09/23/23 0303  WBC 15.5*  --  13.1*  NEUTROABS 13.4*  --   --   HGB 11.9* 12.6* 10.3*  HCT 36.2* 37.0* 30.7*  MCV 99.5  --  96.8  PLT 304  --  243   Basic Metabolic Panel: Recent Labs  Lab 09/22/23 0905 09/22/23 0924 09/22/23 1320 09/22/23 1901 09/22/23 2348 09/23/23 0303  NA 132* 129* 135 133* 133* 139  K 6.4* 6.1* 4.7 4.2 3.6 3.7  CL 92*  --  104 106 108 111  CO2 10*  --  11* 18* 18* 22  GLUCOSE 619*  --  441* 215* 134* 132*  BUN 42*  --  42* 37* 32* 32*  CREATININE 1.81*  --  1.83* 1.46* 1.35* 1.36*  CALCIUM  9.9  --  9.1 9.1 8.6* 8.9   Liver Function Tests: Recent Labs  Lab 09/22/23 0905  AST 18  ALT 21  ALKPHOS 88  BILITOT 0.7  PROT 7.1  ALBUMIN 4.2   CBG: Recent Labs  Lab 09/23/23 0004 09/23/23 0055 09/23/23 0301 09/23/23 0739 09/23/23 1211  GLUCAP 137* 128* 138* 184* 266*    Discharge time spent: greater than 30 minutes.  Signed: Delon Herald, MD [facility] 09/23/2023

## 2023-09-23 NOTE — Inpatient Diabetes Management (Signed)
 Inpatient Diabetes Program Recommendations  AACE/ADA: New Consensus Statement on Inpatient Glycemic Control (2015)  Target Ranges:  Prepandial:   less than 140 mg/dL      Peak postprandial:   less than 180 mg/dL (1-2 hours)      Critically ill patients:  140 - 180 mg/dL   Lab Results  Component Value Date   GLUCAP 184 (H) 09/23/2023   HGBA1C 7.5 (H) 11/26/2021    Review of Glycemic Control  Diabetes history: T1DM Outpatient Diabetes medications: Insulin  pump Current orders for Inpatient glycemic control: Semglee  5 units (transitioned off drip), Novolog  0-15 TID with meals and 0-5 HS  HgbA1C - 7.5%  Pump settings: Tandum Basal: 0.6/H Bolus: Carb ratio 1:15, CF 60, goal 140 mg/dL  Inpatient Diabetes Program Recommendations:    Consider increasing Semglee  to 12 units daily  Add Novolog  3 units TID for meal coverage if eating > 50%  Consider decreasing Novolog  to 0-9 TID with meals and 0-5 HS  For discharge:  Would not restart pump until seen by Endo. Will give pt/wife a Dexcom Discharge pt on basal-bolus insulin .  Watch trends. Meeting with wife today. Met with daughter and pt yesterday afternoon. Daughter thinks pt messes with pump too much and would likely do better with long-acting insulin  1x/day and bolus for meals and correction.  Has appt with Dr Beryl on 09/25/23  Thank you. Shona Brandy, RD, LDN, CDCES Inpatient Diabetes Coordinator [phone]

## 2023-09-23 NOTE — Care Management CC44 (Signed)
 Condition Code 44 Documentation Completed  Patient Details  Name: Rodney Miller MRN: [id number] Date of Birth: Oct 28, 1950   Condition Code 44 given: Yes Patient signature on Condition Code 44 notice: Yes Documentation of 2 MD's agreement: Yes Code 44 added to claim: Yes    Heather DELENA Saltness, LCSW 09/23/2023, 2:05 PM

## 2023-09-23 NOTE — Plan of Care (Signed)
  Problem: Clinical Measurements: Goal: Will remain free from infection Outcome: Progressing   Problem: Activity: Goal: Risk for activity intolerance will decrease Outcome: Progressing   Problem: Coping: Goal: Level of anxiety will decrease Outcome: Progressing   Problem: Safety: Goal: Ability to remain free from injury will improve Outcome: Progressing   

## 2023-09-23 NOTE — Hospital Course (Signed)
 72yo with T1DM, dementia, HTN, and hypothyroidism who presented on 7/7 with AMS.  He was found to be in DKA (pH 7.17, CO2 10) with AKI.  He was started on Endotool and DKA resolved overnight.

## 2023-09-27 LAB — CULTURE, BLOOD (ROUTINE X 2)
Culture: NO GROWTH
Culture: NO GROWTH

## 2023-11-25 ENCOUNTER — Encounter: Payer: Self-pay | Admitting: Podiatry

## 2023-11-25 ENCOUNTER — Ambulatory Visit: Admitting: Podiatry

## 2023-11-25 DIAGNOSIS — E1065 Type 1 diabetes mellitus with hyperglycemia: Secondary | ICD-10-CM | POA: Diagnosis not present

## 2023-11-25 DIAGNOSIS — M79675 Pain in left toe(s): Secondary | ICD-10-CM | POA: Diagnosis not present

## 2023-11-25 DIAGNOSIS — B351 Tinea unguium: Secondary | ICD-10-CM | POA: Diagnosis not present

## 2023-11-25 DIAGNOSIS — M79674 Pain in right toe(s): Secondary | ICD-10-CM

## 2023-11-30 NOTE — Progress Notes (Signed)
  Subjective:  Patient ID: Rodney Miller, male    DOB: [birth year],  MRN: [id number]  Rodney Miller presents to clinic today for preventative diabetic foot care for painful thick toenails that are difficult to trim. Pain interferes with ambulation. Aggravating factors include wearing enclosed shoe gear. Pain is relieved with periodic professional debridement. He is accompanied by his wife on today's visit. Chief Complaint  Patient presents with   Nail Problem    Thick painful toenails, 3 month follow up    New problem(s): None.   PCP is [place], Comer GAILS, NP. [name] 11/20/2023.  No Known Allergies  Review of Systems: Negative except as noted in the HPI.  Objective: No changes noted in today's physical examination. There were no vitals filed for this visit. Rodney Miller is a pleasant 73 y.o. male WD, WN in NAD. AAO x 3.  Vascular Examination: Capillary refill time immediate b/l. Vascular status intact b/l with palpable pedal pulses. Pedal hair present b/l. No pain with calf compression b/l. Skin temperature gradient WNL b/l. No cyanosis or clubbing b/l. No ischemia or gangrene noted b/l.   Neurological Examination: Sensation grossly intact b/l with 10 gram monofilament. Vibratory sensation intact b/l.   Dermatological Examination: Pedal skin with normal turgor, texture and tone b/l.  No open wounds. No interdigital macerations.   Toenails 1-5 b/l thick, discolored, elongated with subungual debris and pain on dorsal palpation.   No corns, calluses nor porokeratotic lesions noted.  Musculoskeletal Examination: Muscle strength 5/5 to all lower extremity muscle groups bilaterally. Pain on palpation of posterior calcaneus at insertion of Achilles tendon b/l lower extremities.  No palpable defect to indicate tear. No erythema, no ecchymosis. HAV with bunion b/l.  Radiographs: None  Assessment/Plan: 1. Pain due to onychomycosis of toenails of both feet   2. Type 1 diabetes mellitus with  hyperglycemia (HCC)   Patient was evaluated and treated. All patient's and/or POA's questions/concerns addressed on today's visit. Mycotic toenails 1-5 debrided in length and girth without incident.  Continue daily foot inspections and monitor blood glucose per PCP/Endocrinologist's recommendations.Continue soft, supportive shoe gear daily. Report any pedal injuries to medical professional. Call office if there are any quesitons/concerns. -Patient/POA to call should there be question/concern in the interim.   Return in about 3 months (around 02/24/2024).  Delon LITTIE Merlin, DPM      [place] LOCATION: [address] [place] [address]                   Office [phone]   [facility] LOCATION: [address] [place], [place] [address] Office [phone]

## 2024-03-03 ENCOUNTER — Ambulatory Visit: Admitting: Podiatry

## 2024-03-03 DIAGNOSIS — E1065 Type 1 diabetes mellitus with hyperglycemia: Secondary | ICD-10-CM

## 2024-03-03 DIAGNOSIS — M79675 Pain in left toe(s): Secondary | ICD-10-CM | POA: Diagnosis not present

## 2024-03-03 DIAGNOSIS — B351 Tinea unguium: Secondary | ICD-10-CM | POA: Diagnosis not present

## 2024-03-03 DIAGNOSIS — M79674 Pain in right toe(s): Secondary | ICD-10-CM

## 2024-03-03 NOTE — Progress Notes (Unsigned)
°  Subjective:  Patient ID: Rodney Miller, male    DOB: 01-May-1950,  MRN: [id number]  Rodney Miller presents to clinic today for preventative diabetic foot care for painful mycotic toenails x 10 which interfere with daily activities. Pain is relieved with periodic professional debridement. His wife is present during today's visit. Chief Complaint  Patient presents with   [facility]    DFC A1c 7.5,  Rodney Miller, Rodney GAILS, NP PCP -    New problem(s): None.   PCP is Rodney Miller, Rodney GAILS, NP. Rodney Miller 02/19/24.  Allergies[1]  Review of Systems: Negative except as noted in the HPI.  Objective: No changes noted in today's physical examination. There were no vitals filed for this visit. Rodney Miller is a pleasant 73 y.o. male WD, WN in NAD. AAO x 3.  Vascular Examination: Capillary refill time immediate b/l. Vascular status intact b/l with palpable pedal pulses. Pedal hair present b/l. No pain with calf compression b/l. Skin temperature gradient WNL b/l. No cyanosis or clubbing b/l. No ischemia or gangrene noted b/l.   Neurological Examination: Sensation grossly intact b/l with 10 gram monofilament. Vibratory sensation intact b/l.   Dermatological Examination: Pedal skin with normal turgor, texture and tone b/l.  No open wounds. No interdigital macerations.   Toenails 1-5 b/l thick, discolored, elongated with subungual debris and pain on dorsal palpation.   No corns, calluses nor porokeratotic lesions noted.  Musculoskeletal Examination: Muscle strength 5/5 to all lower extremity muscle groups bilaterally. Pain on palpation of posterior calcaneus at insertion of Achilles tendon b/l lower extremities.  No palpable defect to indicate tear. No erythema, no ecchymosis. HAV with bunion b/l.  Radiographs: None  Assessment/Plan: 1. Pain due to onychomycosis of toenails of both feet   2. Type 1 diabetes mellitus with hyperglycemia (HCC)     No orders of the defined types were placed in this encounter.    None Consent given for treatment. Patient examined. All patient's and/or POA's questions/concerns addressed on today's visit. Toenails 1-5 b/l debrided in length and girth without incident. Continue foot and shoe inspections daily. Monitor blood glucose per PCP/Endocrinologist's recommendations. Continue soft, supportive shoe gear daily. Report any pedal injuries to medical professional. Call office if there are any questions/concerns. -Patient/POA to call should there be question/concern in the interim.   Return in about 3 months (around 06/01/2024).  Rodney Miller, DPM      [place] LOCATION: [address] [place] [address]                   Office [phone]   [facility] LOCATION: [address] [place], [place] [address] Office [phone]     [1] No Known Allergies

## 2024-03-07 ENCOUNTER — Encounter: Payer: Self-pay | Admitting: Podiatry

## 2024-06-16 ENCOUNTER — Ambulatory Visit: Admitting: Podiatry
